# Patient Record
Sex: Female | Born: 1970 | Hispanic: No | Marital: Married | Smoking: Current some day smoker
Health system: Southern US, Community
[De-identification: ages and names within clinical notes are randomized; demographics above are authoritative.]

## PROBLEM LIST (undated history)

## (undated) DIAGNOSIS — N390 Urinary tract infection, site not specified: Secondary | ICD-10-CM

## (undated) DIAGNOSIS — Z789 Other specified health status: Secondary | ICD-10-CM

## (undated) HISTORY — PX: NO PAST SURGERIES: SHX2092

## (undated) HISTORY — DX: Urinary tract infection, site not specified: N39.0

---

## 2020-07-30 ENCOUNTER — Encounter (HOSPITAL_BASED_OUTPATIENT_CLINIC_OR_DEPARTMENT_OTHER): Payer: Self-pay | Admitting: Emergency Medicine

## 2020-07-30 ENCOUNTER — Encounter (HOSPITAL_COMMUNITY): Payer: Self-pay | Admitting: Obstetrics and Gynecology

## 2020-07-30 ENCOUNTER — Inpatient Hospital Stay (HOSPITAL_COMMUNITY)
Admission: AD | Admit: 2020-07-30 | Discharge: 2020-07-31 | Disposition: A | Discharge status: Home / Self Care | Payer: Self-pay | Attending: Obstetrics and Gynecology | Admitting: Obstetrics and Gynecology

## 2020-07-30 ENCOUNTER — Emergency Department (HOSPITAL_BASED_OUTPATIENT_CLINIC_OR_DEPARTMENT_OTHER)
Admission: EM | Admit: 2020-07-30 | Discharge: 2020-07-30 | Disposition: A | Discharge status: Home / Self Care | Payer: Self-pay | Attending: Emergency Medicine | Admitting: Emergency Medicine

## 2020-07-30 ENCOUNTER — Inpatient Hospital Stay (HOSPITAL_COMMUNITY): Payer: Self-pay

## 2020-07-30 ENCOUNTER — Other Ambulatory Visit: Payer: Self-pay

## 2020-07-30 DIAGNOSIS — O3680X0 Pregnancy with inconclusive fetal viability, not applicable or unspecified: Secondary | ICD-10-CM

## 2020-07-30 DIAGNOSIS — O200 Threatened abortion: Secondary | ICD-10-CM | POA: Insufficient documentation

## 2020-07-30 DIAGNOSIS — O209 Hemorrhage in early pregnancy, unspecified: Secondary | ICD-10-CM | POA: Insufficient documentation

## 2020-07-30 DIAGNOSIS — Z3A00 Weeks of gestation of pregnancy not specified: Secondary | ICD-10-CM | POA: Insufficient documentation

## 2020-07-30 DIAGNOSIS — Z3A01 Less than 8 weeks gestation of pregnancy: Secondary | ICD-10-CM | POA: Insufficient documentation

## 2020-07-30 DIAGNOSIS — O3413 Maternal care for benign tumor of corpus uteri, third trimester: Secondary | ICD-10-CM | POA: Insufficient documentation

## 2020-07-30 DIAGNOSIS — D251 Intramural leiomyoma of uterus: Secondary | ICD-10-CM | POA: Insufficient documentation

## 2020-07-30 DIAGNOSIS — O4690 Antepartum hemorrhage, unspecified, unspecified trimester: Secondary | ICD-10-CM

## 2020-07-30 DIAGNOSIS — Z6790 Unspecified blood type, Rh positive: Secondary | ICD-10-CM

## 2020-07-30 HISTORY — DX: Other specified health status: Z78.9

## 2020-07-30 LAB — URINALYSIS, ROUTINE W REFLEX MICROSCOPIC
Bilirubin Urine: NEGATIVE
Glucose, UA: NEGATIVE mg/dL
Ketones, ur: NEGATIVE mg/dL
Nitrite: NEGATIVE
Protein, ur: NEGATIVE mg/dL
Specific Gravity, Urine: 1.01 (ref 1.005–1.030)
pH: 6 (ref 5.0–8.0)

## 2020-07-30 LAB — COMPREHENSIVE METABOLIC PANEL
ALT: 15 U/L (ref 0–44)
AST: 20 U/L (ref 15–41)
Albumin: 4.3 g/dL (ref 3.5–5.0)
Alkaline Phosphatase: 39 U/L (ref 38–126)
Anion gap: 9 (ref 5–15)
BUN: 17 mg/dL (ref 6–20)
CO2: 24 mmol/L (ref 22–32)
Calcium: 9.2 mg/dL (ref 8.9–10.3)
Chloride: 103 mmol/L (ref 98–111)
Creatinine, Ser: 0.73 mg/dL (ref 0.44–1.00)
GFR, Estimated: >60 mL/min (ref >60)
Glucose, Bld: 99 mg/dL (ref 70–99)
Potassium: 3.8 mmol/L (ref 3.5–5.1)
Sodium: 136 mmol/L (ref 135–145)
Total Bilirubin: 0.7 mg/dL (ref 0.3–1.2)
Total Protein: 7.5 g/dL (ref 6.5–8.1)

## 2020-07-30 LAB — CBC WITH DIFFERENTIAL/PLATELET
Abs Immature Granulocytes: 0.04 10*3/uL (ref 0.00–0.07)
Basophils Absolute: 0 10*3/uL (ref 0.0–0.1)
Basophils Relative: 0 %
Eosinophils Absolute: 0.1 10*3/uL (ref 0.0–0.5)
Eosinophils Relative: 1 %
HCT: 42.1 % (ref 36.0–46.0)
Hemoglobin: 13.6 g/dL (ref 12.0–15.0)
Immature Granulocytes: 0 %
Lymphocytes Relative: 16 %
Lymphs Abs: 1.5 10*3/uL (ref 0.7–4.0)
MCH: 29.2 pg (ref 26.0–34.0)
MCHC: 32.3 g/dL (ref 30.0–36.0)
MCV: 90.3 fL (ref 80.0–100.0)
Monocytes Absolute: 0.6 10*3/uL (ref 0.1–1.0)
Monocytes Relative: 6 %
Neutro Abs: 7.1 10*3/uL (ref 1.7–7.7)
Neutrophils Relative %: 77 %
Platelets: 238 10*3/uL (ref 150–400)
RBC: 4.66 MIL/uL (ref 3.87–5.11)
RDW: 12.9 % (ref 11.5–15.5)
WBC: 9.3 10*3/uL (ref 4.0–10.5)
nRBC: 0 % (ref 0.0–0.2)

## 2020-07-30 LAB — PREGNANCY, URINE: Preg Test, Ur: POSITIVE — AB

## 2020-07-30 LAB — ABO/RH: ABO/RH(D): O POS

## 2020-07-30 LAB — URINALYSIS, MICROSCOPIC (REFLEX)

## 2020-07-30 LAB — HCG, QUANTITATIVE, PREGNANCY: hCG, Beta Chain, Quant, S: 448 m[IU]/mL — ABNORMAL HIGH (ref <5)

## 2020-07-30 NOTE — ED Provider Notes (Signed)
[facility] EMERGENCY DEPARTMENT Provider Note   CSN: [id number] Arrival date & time: 07/30/20  1159     History Chief Complaint  Patient presents with  . Vaginal Bleeding    Chloe Tyler is a 49 y.o. female.  HPI   Patient is a 50 year old female G1, P0 whose last menstrual cycle was around November 25.  She states that she had a positive home pregnancy test.  About 5 days ago she began experiencing a small amount of brown vaginal discharge.  Last night she began experiencing lower abdominal cramping as well as vaginal bleeding.  She believes she has soaked about 4 pads since it began last night.  She also believes she has been seeing clots.  Denies any fevers, chills, chest pain, shortness of breath, dysuria.     No past medical history on file.  There are no problems to display for this patient.   OB History    Gravida  1   Para      Term      Preterm      AB      Living        SAB      IAB      Ectopic      Multiple      Live Births              No family history on file.  Social History   Tobacco Use  . Smoking status: Never Smoker  . Smokeless tobacco: Never Used    Home Medications Prior to Admission medications   Not on File    Allergies    Patient has no known allergies.  Review of Systems   Review of Systems  All other systems reviewed and are negative. Ten systems reviewed and are negative for acute change, except as noted in the HPI.   Physical Exam Updated Vital Signs BP 122/85 (BP Location: Right Arm)   Pulse 78   Temp 99.3 F (37.4 C) (Oral)   Resp 17   Ht 5' 4.57" (1.64 m)   Wt 50 kg   LMP 06/21/2020   SpO2 100%   BMI 18.59 kg/m   Physical Exam Vitals and nursing note reviewed.  Constitutional:      General: She is not in acute distress.    Appearance: Normal appearance. She is not ill-appearing, toxic-appearing or diaphoretic.  HENT:     Head: Normocephalic and atraumatic.     Right Ear:  External ear normal.     Left Ear: External ear normal.     Nose: Nose normal.     Mouth/Throat:     Mouth: Mucous membranes are moist.     Pharynx: Oropharynx is clear. No oropharyngeal exudate or posterior oropharyngeal erythema.  Eyes:     Extraocular Movements: Extraocular movements intact.  Cardiovascular:     Rate and Rhythm: Normal rate and regular rhythm.     Pulses: Normal pulses.     Heart sounds: Normal heart sounds. No murmur heard. No friction rub. No gallop.   Pulmonary:     Effort: Pulmonary effort is normal. No respiratory distress.     Breath sounds: Normal breath sounds. No stridor. No wheezing, rhonchi or rales.  Abdominal:     General: Abdomen is flat.     Palpations: Abdomen is soft.     Tenderness: There is abdominal tenderness.     Comments: Mild amount of suprapubic tenderness noted with deep palpation.  Genitourinary:  Comments: Pelvic exam performed by female colleague at request of the patient.  See her note for additional information.  Findings below:  Patient was placed in supine position with stirrups in place. External genitalia appears normal Significant amount of bleeding on the vaginal vault, cervix appears closed at this time.  Bleeding consistent with clots and perhaps tissue present.  CMT was not checked as patient was very uncomfortable with exam.   No samples were collected at this time due to amount of bleeding.  Musculoskeletal:        General: Normal range of motion.     Cervical back: Normal range of motion and neck supple. No tenderness.  Skin:    General: Skin is warm and dry.  Neurological:     General: No focal deficit present.     Mental Status: She is alert and oriented to person, place, and time.  Psychiatric:        Mood and Affect: Mood normal.        Behavior: Behavior normal.    ED Results / Procedures / Treatments   Labs (all labs ordered are listed, but only abnormal results are displayed) Labs Reviewed   URINALYSIS, ROUTINE W REFLEX MICROSCOPIC - Abnormal; Notable for the following components:      Result Value   Hgb urine dipstick LARGE (*)    Leukocytes,Ua MODERATE (*)    All other components within normal limits  PREGNANCY, URINE - Abnormal; Notable for the following components:   Preg Test, Ur POSITIVE (*)    All other components within normal limits  URINALYSIS, MICROSCOPIC (REFLEX) - Abnormal; Notable for the following components:   Bacteria, UA MANY (*)    All other components within normal limits  HCG, QUANTITATIVE, PREGNANCY - Abnormal; Notable for the following components:   hCG, Beta Chain, Quant, S 448 (*)    All other components within normal limits  WET PREP, GENITAL  COMPREHENSIVE METABOLIC PANEL  CBC WITH DIFFERENTIAL/PLATELET  ABO/RH  GC/CHLAMYDIA PROBE AMP ([place]) NOT AT [facility]   EKG None  Radiology No results found.  Procedures Procedures (including critical care time)  Medications Ordered in ED Medications - No data to display  ED Course  I have reviewed the triage vital signs and the nursing notes.  Pertinent labs & imaging results that were available during my care of the patient were reviewed by me and considered in my medical decision making (see chart for details).  Clinical Course as of 07/30/20 2004  Mon Jul 30, 2020  1935 HCG, Beta Chain, Quant, [place](!): 448 [LJ]  1935 Preg Test, Ur(!): POSITIVE [LJ]    Clinical Course User Index [LJ] Placido Sou, PA-C   MDM Rules/Calculators/A&P                          Patient is a 50 year old female that presents the emergency department due to vaginal bleeding.  This started last night.  She took a home pregnancy test which was positive.  Pregnancy test today was also positive.  Quantitative hCG of 448.  Patient and her husband requested a female PA-C perform a pelvic exam.  Findings as noted above.  Patient had a large amount of vaginal bleeding as well as a closed cervical os.  Discussed  transfer to the MAU as we do not currently have ultrasound at our facility and patient and her husband are amenable.  Patient discussed with APP Victorino Dike at [facility] and they will accept  patient.  She will transfer POV.  Her husband is going to transport her.  Recommended that the patient go to the MAU immediately for further evaluation.  Final Clinical Impression(s) / ED Diagnoses Final diagnoses:  Threatened miscarriage in early pregnancy    Rx / DC Orders ED Discharge Orders    None       Rayna Sexton, PA-C 07/30/20 2010    [address] DO 07/30/20 2203

## 2020-07-30 NOTE — MAU Note (Signed)
Pt reports positive preg test and yesterday and today she has had bleeding. Pt reports lower abd and lower back pain. Pt reports she is unable to stand up

## 2020-07-30 NOTE — Discharge Instructions (Addendum)
Like we discussed, please try directly to the maternity assessment unit.  This is located at the [facility] in [place].  The address is below.

## 2020-07-30 NOTE — Discharge Instructions (Signed)
Ectopic Pregnancy ° °An ectopic pregnancy is when the fertilized egg attaches (implants) outside the uterus. Most ectopic pregnancies occur in one of the tubes where eggs travel from the ovary to the uterus (fallopian tubes), but the implanting can occur in other locations. In rare cases, ectopic pregnancies occur on the ovary, intestine, pelvis, abdomen, or cervix. In an ectopic pregnancy, the fertilized egg does not have the ability to develop into a normal, healthy baby. °A ruptured ectopic pregnancy is one in which tearing or bursting of a fallopian tube causes internal bleeding. Often, there is intense lower abdominal pain, and vaginal bleeding sometimes occurs. Having an ectopic pregnancy can be life-threatening. If this dangerous condition is not treated, it can lead to blood loss, shock, or even death. °What are the causes? °The most common cause of this condition is damage to one of the fallopian tubes. A fallopian tube may be narrowed or blocked, and that keeps the fertilized egg from reaching the uterus. °What increases the risk? °This condition is more likely to develop in women of childbearing age who have different levels of risk. The levels of risk can be divided into three categories. °High risk °· You have gone through infertility treatment. °· You have had an ectopic pregnancy before. °· You have had surgery on the fallopian tubes, or another surgical procedure, such as an abortion. °· You have had surgery to have the fallopian tubes tied (tubal ligation). °· You have problems or diseases of the fallopian tubes. °· You have been exposed to diethylstilbestrol (DES). This medicine was used until 1971, and it had effects on babies whose mothers took the medicine. °· You become pregnant while using an IUD (intrauterine device) for birth control. °Moderate risk °· You have a history of infertility. °· You have had an STI (sexually transmitted infection). °· You have a history of pelvic inflammatory  disease (PID). °· You have scarring from endometriosis. °· You have multiple sexual partners. °· You smoke. °Low risk °· You have had pelvic surgery. °· You use vaginal douches. °· You became sexually active before age 18. °What are the signs or symptoms? °Common symptoms of this condition include normal pregnancy symptoms, such as missing a period, nausea, tiredness, abdominal pain, breast tenderness, and bleeding. However, ectopic pregnancy will have additional symptoms, such as: °· Pain with intercourse. °· Irregular vaginal bleeding or spotting. °· Cramping or pain on one side or in the lower abdomen. °· Fast heartbeat, low blood pressure, and sweating. °· Passing out while having a bowel movement. °Symptoms of a ruptured ectopic pregnancy and internal bleeding may include: °· Sudden, severe pain in the abdomen and pelvis. °· Dizziness, weakness, light-headedness, or fainting. °· Pain in the shoulder or neck area. °How is this diagnosed? °This condition is diagnosed by: °· A pelvic exam to locate pain or a mass in the abdomen. °· A pregnancy test. This blood test checks for the presence as well as the specific level of pregnancy hormone in the bloodstream. °· Ultrasound. This is performed if a pregnancy test is positive. In this test, a probe is inserted into the vagina. The probe will detect a fetus, possibly in a location other than the uterus. °· Taking a sample of uterus tissue (dilation and curettage, or D&C). °· Surgery to perform a visual exam of the inside of the abdomen using a thin, lighted tube that has a tiny camera on the end (laparoscope). °· Culdocentesis. This procedure involves inserting a needle at the top of   the vagina, behind the uterus. If blood is present in this area, it may indicate that a fallopian tube is torn. How is this treated? This condition is treated with medicine or surgery. Medicine  An injection of a medicine (methotrexate) may be given to cause the pregnancy tissue to be  absorbed. This medicine may save your fallopian tube. It may be given if: ? The diagnosis is made early, with no signs of active bleeding. ? The fallopian tube has not ruptured. ? You are considered to be a good candidate for the medicine. Usually, pregnancy hormone blood levels are checked after methotrexate treatment. This is to be sure that the medicine is effective. It may take 4-6 weeks for the pregnancy to be absorbed. Most pregnancies will be absorbed by 3 weeks. Surgery  A laparoscope may be used to remove the pregnancy tissue.  If severe internal bleeding occurs, a larger cut (incision) may be made in the lower abdomen (laparotomy) to remove the fetus and placenta. This is done to stop the bleeding.  Part or all of the fallopian tube may be removed (salpingectomy) along with the fetus and placenta. The fallopian tube may also be repaired during the surgery.  In very rare circumstances, removal of the uterus (hysterectomy) may be required.  After surgery, pregnancy hormone testing may be done to be sure that there is no pregnancy tissue left. Whether your treatment is medicine or surgery, you may receive a Rho (D) immune globulin shot to prevent problems with any future pregnancy. This shot may be given if:  You are Rh-negative and the baby's father is Rh-positive.  You are Rh-negative and you do not know the Rh type of the baby's father. Follow these instructions at home:  Rest and limit your activity after the procedure for as long as told by your health care provider.  Until your health care provider says that it is safe: ? Do not lift anything that is heavier than 10 lb (4.5 kg), or the limit that your health care provider tells you. ? Avoid physical exercise and any movement that requires effort (is strenuous).  To help prevent constipation: ? Eat a healthy diet that includes fruits, vegetables, and whole grains. ? Drink 6-8 glasses of water per day. Get help right away  if:  You develop worsening pain that is not relieved by medicine.  You have: ? A fever or chills. ? Vaginal bleeding. ? Redness and swelling at the incision site. ? Nausea and vomiting.  You feel dizzy or weak.  You feel light-headed or you faint. This information is not intended to replace advice given to you by your health care provider. Make sure you discuss any questions you have with your health care provider. Document Revised: 06/26/2017 Document Reviewed: 02/13/2016 Elsevier Patient Education  [place].        Ruptured Ectopic Pregnancy  An ectopic pregnancy is when a fertilized egg attaches (implants) outside of the uterus, usually in a fallopian tube. A ruptured ectopic pregnancy is when the fallopian tube tears or bursts. This results in internal bleeding, intense abdominal pain, and sometimes, vaginal bleeding. Most ectopic pregnancies occur in the fallopian tube. In rare cases, it may occur on the ovary, intestine, pelvis, or cervix. An ectopic pregnancy does not have the ability to develop into a normal, healthy baby. A ruptured ectopic pregnancy can affect your ability to have children (fertility), depending on damage it causes to your reproductive organs. Ruptured ectopic pregnancy is a medical  emergency. If not treated immediately, it can lead to blood loss, shock, or even death. What are the causes? Most ectopic pregnancies are caused by damage to the fallopian tubes. The damage prevents the fertilized egg from implanting in the uterus. In some cases, the cause may not be known. What increases the risk? You are at increased risk for an ectopic pregnancy if:  You have had a previous ectopic pregnancy.  You have had previous fallopian tube surgery.  You have had previous surgery to have the fallopian tubes tied (tubal ligation).  You have had infertility treatments or have a history of infertility.  You have been exposed to DES. DES is a medicine that  was used until 1971 and had effects on babies whose mothers took the medicine.  You use an IUD (intrauterine device) for birth control.  You use progestin-only oral contraception for birth control.  You have a history of pelvic inflammatory disease (PID).  You have a history of endometriosis.  You smoke.  You became sexually active before 48 years of age.  You have multiple sexual partners. What are the signs or symptoms? Symptoms of a ruptured ectopic pregnancy and internal bleeding may include:  Sudden, severe pain in the abdomen and pelvis.  Dizziness or fainting.  Pain in the shoulder area.  Vaginal bleeding. How is this diagnosed? This condition is diagnosed based on your medical history, symptoms, a physical exam, and tests, which may include:  A pregnancy test.  An ultrasound.  Measuring the levels of the pregnancy hormone in the bloodstream.  Taking a sample of tissue from the uterus (dilation and curettage, D&C).  Surgery to visually examine the inside of the abdomen using a lighted tube (laparoscopy). How is this treated? This condition is treated with IV fluids and emergency surgery to remove the ectopic pregnancy and repair the area where the rupture occured. If you have lost a lot of blood, you may need a blood transfusion. If you are Rh negative and your baby's father is Rh positive, or the Rh type of the father is unknown, you may receive a Rho (D) immune globulin shot. This is to prevent Rh problems in future pregnancies. Additional medicines may be given. Get help right away if:  You are taking medicines to treat an ectopic pregnancy and you develop symptoms of a rupture. These include: ? Fever or chills. ? Shoulder pain. ? Vaginal bleeding. ? Nausea and vomiting. ? Severe abdominal pain or cramping. ? Feeling light-headed or fainting. Summary  An ectopic pregnancy is when a fertilized egg attaches (implants) outside of the uterus, usually in a  fallopian tube. A ruptured ectopic pregnancy is when the fallopian tube tears or bursts.  Ruptured ectopic pregnancy is a medical emergency. If not treated immediately, it can lead to blood loss, shock, or even death.  This condition is treated with IV fluids and emergency surgery to remove the ectopic pregnancy and repair the area where the rupture occured. If you have lost a lot of blood, you may need a blood transfusion. This information is not intended to replace advice given to you by your health care provider. Make sure you discuss any questions you have with your health care provider. Document Revised: 06/26/2017 Document Reviewed: 10/01/2016 Elsevier Patient Education  [place].         Threatened Miscarriage  A threatened miscarriage occurs when a woman has vaginal bleeding during the first 20 weeks of pregnancy but the pregnancy has not ended. If  you have vaginal bleeding during this time, your health care provider will do tests to make sure you are still pregnant. If the tests show that you are still pregnant and that the developing baby (fetus) inside your uterus is still growing, your condition is considered a threatened miscarriage. A threatened miscarriage does not mean your pregnancy will end, but it does increase the risk of losing your pregnancy (complete miscarriage). What are the causes? The cause of this condition is usually not known. For women who go on to have a complete miscarriage, the most common cause is an abnormal number of chromosomes in the developing baby. Chromosomes are the structures inside cells that hold all of a person's genetic material. What increases the risk? The following lifestyle factors may increase your risk of a miscarriage in early pregnancy:  Smoking.  Drinking excessive amounts of alcohol or caffeine.  Recreational drug use. The following preexisting health conditions may increase your risk of a miscarriage in early  pregnancy:  Polycystic ovary syndrome.  Uterine fibroids.  Infections.  Diabetes mellitus. What are the signs or symptoms? Symptoms of this condition include:  Vaginal bleeding.  Mild abdominal pain or cramps. How is this diagnosed? If you have bleeding with or without abdominal pain before 20 weeks of pregnancy, your health care provider will do tests to check whether you are still pregnant. These will include:  Ultrasound. This test uses sound waves to create images of the inside of your uterus. This allows your health care provider to look at your developing baby and other structures, such as your placenta.  Pelvic exam. This is an internal exam of your vagina and cervix.  Measurement of your baby's heart rate.  Laboratory tests such as blood tests, urine tests, or swabs for infection You may be diagnosed with a threatened miscarriage if:  Ultrasound testing shows that you are still pregnant.  Your babys heart rate is strong.  A pelvic exam shows that the opening between your uterus and your vagina (cervix) is closed.  Blood tests confirm that you are still pregnant. How is this treated? No treatments have been shown to prevent a threatened miscarriage from going on to a complete miscarriage. However, the right home care is important. Follow these instructions at home:  Get plenty of rest.  Do not have sex or use tampons if you have vaginal bleeding.  Do not douche.  Do not smoke or use recreational drugs.  Do not drink alcohol.  Avoid caffeine.  Keep all follow-up prenatal visits as told by your health care provider. This is important. Contact a health care provider if:  You have light vaginal bleeding or spotting while pregnant.  You have abdominal pain or cramping.  You have a fever. Get help right away if:  You have heavy vaginal bleeding.  You have blood clots coming from your vagina.  You pass tissue from your vagina.  You leak fluid, or you  have a gush of fluid from your vagina.  You have severe low back pain or abdominal cramps.  You have fever, chills, and severe abdominal pain. Summary  A threatened miscarriage occurs when a woman has vaginal bleeding during the first 20 weeks of pregnancy but the pregnancy has not ended.  The cause of a threatened miscarriage is usually not known.  Symptoms of this condition may include vaginal bleeding and mild abdominal pain or cramps.  No treatments have been shown to prevent a threatened miscarriage from going on to a complete  miscarriage.  Keep all follow-up prenatal visits as told by your health care provider. This is important. This information is not intended to replace advice given to you by your health care provider. Make sure you discuss any questions you have with your health care provider. Document Revised: 08/20/2017 Document Reviewed: 10/10/2016 Elsevier Patient Education  [place].         Vaginal Bleeding During Pregnancy, First Trimester  A small amount of bleeding from the vagina (spotting) is relatively common during early pregnancy. It usually stops on its own. Various things may cause bleeding or spotting during early pregnancy. Some bleeding may be related to the pregnancy, and some may not. In many cases, the bleeding is normal and is not a problem. However, bleeding can also be a sign of something serious. Be sure to tell your health care provider about any vaginal bleeding right away. Some possible causes of vaginal bleeding during the first trimester include:  Infection or inflammation of the cervix.  Growths (polyps) on the cervix.  Miscarriage or threatened miscarriage.  Pregnancy tissue developing outside of the uterus (ectopic pregnancy).  A mass of tissue developing in the uterus due to an egg being fertilized incorrectly (molar pregnancy). Follow these instructions at home: Activity  Follow instructions from your health care  provider about limiting your activity. Ask what activities are safe for you.  If needed, make plans for someone to help with your regular activities.  Do not have sex or orgasms until your health care provider says that this is safe. General instructions  Take over-the-counter and prescription medicines only as told by your health care provider.  Pay attention to any changes in your symptoms.  Do not use tampons or douche.  Write down how many pads you use each day, how often you change pads, and how soaked (saturated) they are.  If you pass any tissue from your vagina, save the tissue so you can show it to your health care provider.  Keep all follow-up visits as told by your health care provider. This is important. Contact a health care provider if:  You have vaginal bleeding during any part of your pregnancy.  You have cramps or labor pains.  You have a fever. Get help right away if:  You have severe cramps in your back or abdomen.  You pass large clots or a large amount of tissue from your vagina.  Your bleeding increases.  You feel light-headed or weak, or you faint.  You have chills.  You are leaking fluid or have a gush of fluid from your vagina. Summary  A small amount of bleeding (spotting) from the vagina is relatively common during early pregnancy.  Various things may cause bleeding or spotting in early pregnancy.  Be sure to tell your health care provider about any vaginal bleeding right away. This information is not intended to replace advice given to you by your health care provider. Make sure you discuss any questions you have with your health care provider. Document Revised: 11/02/2018 Document Reviewed: 10/16/2016 Elsevier Patient Education  2020 [place] A miscarriage is the loss of an unborn baby (fetus) before the 20th week of pregnancy. Most miscarriages happen during the first 3 months of pregnancy. Sometimes, a  miscarriage can happen before a woman knows that she is pregnant. Having a miscarriage can be an emotional experience. If you have had a miscarriage, talk with your health care provider about any  questions you may have about miscarrying, the grieving process, and your plans for future pregnancy. What are the causes? A miscarriage may be caused by:  Problems with the genes or chromosomes of the fetus. These problems make it impossible for the baby to develop normally. They are often the result of random errors that occur early in the development of the baby, and are not passed from parent to child (not inherited).  Infection of the cervix or uterus.  Conditions that affect hormone balance in the body.  Problems with the cervix, such as the cervix opening and thinning before pregnancy is at term (cervical insufficiency).  Problems with the uterus. These may include: ? A uterus with an abnormal shape. ? Fibroids in the uterus. ? Congenital abnormalities. These are problems that were present at birth.  Certain medical conditions.  Smoking, drinking alcohol, or using drugs.  Injury (trauma). In many cases, the cause of a miscarriage is not known. What are the signs or symptoms? Symptoms of this condition include:  Vaginal bleeding or spotting, with or without cramps or pain.  Pain or cramping in the abdomen or lower back.  Passing fluid, tissue, or blood clots from the vagina. How is this diagnosed? This condition may be diagnosed based on:  A physical exam.  Ultrasound.  Blood tests.  Urine tests. How is this treated? Treatment for a miscarriage is sometimes not necessary if you naturally pass all the tissue that was in your uterus. If necessary, this condition may be treated with:  Dilation and curettage (D&C). This is a procedure in which the cervix is stretched open and the lining of the uterus (endometrium) is scraped. This is done only if tissue from the fetus or  placenta remains in the body (incomplete miscarriage).  Medicines, such as: ? Antibiotic medicine, to treat infection. ? Medicine to help the body pass any remaining tissue. ? Medicine to reduce (contract) the size of the uterus. These medicines may be given if you have a lot of bleeding. If you have Rh negative blood and your baby was Rh positive, you will need a shot of a medicine called Rh immunoglobulinto protect your future babies from Rh blood problems. "Rh-negative" and "Rh-positive" refer to whether or not the blood has a specific protein found on the surface of red blood cells (Rh factor). Follow these instructions at home: Medicines   Take over-the-counter and prescription medicines only as told by your health care provider.  If you were prescribed antibiotic medicine, take it as told by your health care provider. Do not stop taking the antibiotic even if you start to feel better.  Do not take NSAIDs, such as aspirin and ibuprofen, unless they are approved by your health care provider. These medicines can cause bleeding. Activity  Rest as directed. Ask your health care provider what activities are safe for you.  Have someone help with home and family responsibilities during this time. General instructions  Keep track of the number of sanitary pads you use each day and how soaked (saturated) they are. Write down this information.  Monitor the amount of tissue or blood clots that you pass from your vagina. Save any large amounts of tissue for your health care provider to examine.  Do not use tampons, douche, or have sex until your health care provider approves.  To help you and your partner with the process of grieving, talk with your health care provider or seek counseling.  When you are ready, meet with your  health care provider to discuss any important steps you should take for your health. Also, discuss steps you should take to have a healthy pregnancy in the  future.  Keep all follow-up visits as told by your health care provider. This is important. Where to find more information  The American Congress of Obstetricians and Gynecologists: www.acog.org  [place] Department of Health and [employer], systems of Womens Health: [place] Contact a health care provider if:  You have a fever or chills.  You have a foul smelling vaginal discharge.  You have more bleeding instead of less. Get help right away if:  You have severe cramps or pain in your back or abdomen.  You pass blood clots or tissue from your vagina that is walnut-sized or larger.  You soak more than 1 regular sanitary pad in an hour.  You become light-headed or weak.  You pass out.  You have feelings of sadness that take over your thoughts, or you have thoughts of hurting yourself. Summary  Most miscarriages happen in the first 3 months of pregnancy. Sometimes miscarriage happens before a woman even knows that she is pregnant.  Follow your health care provider's instruction for home care. Keep all follow-up appointments.  To help you and your partner with the process of grieving, talk with your health care provider or seek counseling. This information is not intended to replace advice given to you by your health care provider. Make sure you discuss any questions you have with your health care provider. Document Revised: 11/05/2018 Document Reviewed: 08/19/2016 Elsevier Patient Education  [place] of Pregnancy The first trimester of pregnancy is from week 1 until the end of week 13 (months 1 through 3). A week after a sperm fertilizes an egg, the egg will implant on the wall of the uterus. This embryo will begin to develop into a baby. Genes from you and your partner will form the baby. The female genes will determine whether the baby will be a boy or a girl. At 6-8 weeks, the eyes and face will be formed, and the heartbeat can  be seen on ultrasound. At the end of 12 weeks, all the baby's organs will be formed. Now that you are pregnant, you will want to do everything you can to have a healthy baby. Two of the most important things are to get good prenatal care and to follow your health care provider's instructions. Prenatal care is all the medical care you receive before the baby's birth. This care will help prevent, find, and treat any problems during the pregnancy and childbirth. Body changes during your first trimester Your body goes through many changes during pregnancy. The changes vary from woman to woman.  You may gain or lose a couple of pounds at first.  You may feel sick to your stomach (nauseous) and you may throw up (vomit). If the vomiting is uncontrollable, call your health care provider.  You may tire easily.  You may develop headaches that can be relieved by medicines. All medicines should be approved by your health care provider.  You may urinate more often. Painful urination may mean you have a bladder infection.  You may develop heartburn as a result of your pregnancy.  You may develop constipation because certain hormones are causing the muscles that push stool through your intestines to slow down.  You may develop hemorrhoids or swollen veins (varicose veins).  Your breasts may begin to  grow larger and become tender. Your nipples may stick out more, and the tissue that surrounds them (areola) may become darker.  Your gums may bleed and may be sensitive to brushing and flossing.  Dark spots or blotches (chloasma, mask of pregnancy) may develop on your face. This will likely fade after the baby is born.  Your menstrual periods will stop.  You may have a loss of appetite.  You may develop cravings for certain kinds of food.  You may have changes in your emotions from day to day, such as being excited to be pregnant or being concerned that something may go wrong with the pregnancy and  baby.  You may have more vivid and strange dreams.  You may have changes in your hair. These can include thickening of your hair, rapid growth, and changes in texture. Some women also have hair loss during or after pregnancy, or hair that feels dry or thin. Your hair will most likely return to normal after your baby is born. What to expect at prenatal visits During a routine prenatal visit:  You will be weighed to make sure you and the baby are growing normally.  Your blood pressure will be taken.  Your abdomen will be measured to track your baby's growth.  The fetal heartbeat will be listened to between weeks 10 and 14 of your pregnancy.  Test results from any previous visits will be discussed. Your health care provider may ask you:  How you are feeling.  If you are feeling the baby move.  If you have had any abnormal symptoms, such as leaking fluid, bleeding, severe headaches, or abdominal cramping.  If you are using any tobacco products, including cigarettes, chewing tobacco, and electronic cigarettes.  If you have any questions. Other tests that may be performed during your first trimester include:  Blood tests to find your blood type and to check for the presence of any previous infections. The tests will also be used to check for low iron levels (anemia) and protein on red blood cells (Rh antibodies). Depending on your risk factors, or if you previously had diabetes during pregnancy, you may have tests to check for high blood sugar that affects pregnant women (gestational diabetes).  Urine tests to check for infections, diabetes, or protein in the urine.  An ultrasound to confirm the proper growth and development of the baby.  Fetal screens for spinal cord problems (spina bifida) and Down syndrome.  HIV (human immunodeficiency virus) testing. Routine prenatal testing includes screening for HIV, unless you choose not to have this test.  You may need other tests to make  sure you and the baby are doing well. Follow these instructions at home: Medicines  Follow your health care provider's instructions regarding medicine use. Specific medicines may be either safe or unsafe to take during pregnancy.  Take a prenatal vitamin that contains at least 600 micrograms (mcg) of folic acid.  If you develop constipation, try taking a stool softener if your health care provider approves. Eating and drinking   Eat a balanced diet that includes fresh fruits and vegetables, whole grains, good sources of protein such as meat, eggs, or tofu, and low-fat dairy. Your health care provider will help you determine the amount of weight gain that is right for you.  Avoid raw meat and uncooked cheese. These carry germs that can cause birth defects in the baby.  Eating four or five small meals rather than three large meals a day may help relieve  nausea and vomiting. If you start to feel nauseous, eating a few soda crackers can be helpful. Drinking liquids between meals, instead of during meals, also seems to help ease nausea and vomiting.  Limit foods that are high in fat and processed sugars, such as fried and sweet foods.  To prevent constipation: ? Eat foods that are high in fiber, such as fresh fruits and vegetables, whole grains, and beans. ? Drink enough fluid to keep your urine clear or pale yellow. Activity  Exercise only as directed by your health care provider. Most women can continue their usual exercise routine during pregnancy. Try to exercise for 30 minutes at least 5 days a week. Exercising will help you: ? Control your weight. ? Stay in shape. ? Be prepared for labor and delivery.  Experiencing pain or cramping in the lower abdomen or lower back is a good sign that you should stop exercising. Check with your health care provider before continuing with normal exercises.  Try to avoid standing for long periods of time. Move your legs often if you must stand in one  place for a long time.  Avoid heavy lifting.  Wear low-heeled shoes and practice good posture.  You may continue to have sex unless your health care provider tells you not to. Relieving pain and discomfort  Wear a good support bra to relieve breast tenderness.  Take warm sitz baths to soothe any pain or discomfort caused by hemorrhoids. Use hemorrhoid cream if your health care provider approves.  Rest with your legs elevated if you have leg cramps or low back pain.  If you develop varicose veins in your legs, wear support hose. Elevate your feet for 15 minutes, 3-4 times a day. Limit salt in your diet. Prenatal care  Schedule your prenatal visits by the twelfth week of pregnancy. They are usually scheduled monthly at first, then more often in the last 2 months before delivery.  Write down your questions. Take them to your prenatal visits.  Keep all your prenatal visits as told by your health care provider. This is important. Safety  Wear your seat belt at all times when driving.  Make a list of emergency phone numbers, including numbers for family, friends, the hospital, and police and fire departments. General instructions  Ask your health care provider for a referral to a local prenatal education class. Begin classes no later than the beginning of month 6 of your pregnancy.  Ask for help if you have counseling or nutritional needs during pregnancy. Your health care provider can offer advice or refer you to specialists for help with various needs.  Do not use hot tubs, steam rooms, or saunas.  Do not douche or use tampons or scented sanitary pads.  Do not cross your legs for long periods of time.  Avoid cat litter boxes and soil used by cats. These carry germs that can cause birth defects in the baby and possibly loss of the fetus by miscarriage or stillbirth.  Avoid all smoking, herbs, alcohol, and medicines not prescribed by your health care provider. Chemicals in these  products affect the formation and growth of the baby.  Do not use any products that contain nicotine or tobacco, such as cigarettes and e-cigarettes. If you need help quitting, ask your health care provider. You may receive counseling support and other resources to help you quit.  Schedule a dentist appointment. At home, brush your teeth with a soft toothbrush and be gentle when you floss. Contact a  health care provider if:  You have dizziness.  You have mild pelvic cramps, pelvic pressure, or nagging pain in the abdominal area.  You have persistent nausea, vomiting, or diarrhea.  You have a bad smelling vaginal discharge.  You have pain when you urinate.  You notice increased swelling in your face, hands, legs, or ankles.  You are exposed to fifth disease or chickenpox.  You are exposed to [place] measles (rubella) and have never had it. Get help right away if:  You have a fever.  You are leaking fluid from your vagina.  You have spotting or bleeding from your vagina.  You have severe abdominal cramping or pain.  You have rapid weight gain or loss.  You vomit blood or material that looks like coffee grounds.  You develop a severe headache.  You have shortness of breath.  You have any kind of trauma, such as from a fall or a car accident. Summary  The first trimester of pregnancy is from week 1 until the end of week 13 (months 1 through 3).  Your body goes through many changes during pregnancy. The changes vary from woman to woman.  You will have routine prenatal visits. During those visits, your health care provider will examine you, discuss any test results you may have, and talk with you about how you are feeling. This information is not intended to replace advice given to you by your health care provider. Make sure you discuss any questions you have with your health care provider. Document Revised: 06/26/2017 Document Reviewed: 06/25/2016 Elsevier Patient  Education  [place].                        Safe Medications in Pregnancy    Acne: Benzoyl Peroxide Salicylic Acid  Backache/Headache: Tylenol: 2 regular strength every 4 hours OR              2 Extra strength every 6 hours  Colds/Coughs/Allergies: Benadryl (alcohol free) 25 mg every 6 hours as needed Breath right strips Claritin Cepacol throat lozenges Chloraseptic throat spray Cold-Eeze- up to three times per day Cough drops, alcohol free Flonase (by prescription only) Guaifenesin Mucinex Robitussin DM (plain only, alcohol free) Saline nasal spray/drops Sudafed (pseudoephedrine) & Actifed ** use only after [redacted] weeks gestation and if you do not have high blood pressure Tylenol Vicks Vaporub Zinc lozenges Zyrtec   Constipation: Colace Ducolax suppositories Fleet enema Glycerin suppositories Metamucil Milk of magnesia Miralax Senokot Smooth move tea  Diarrhea: Kaopectate Imodium A-D  *NO pepto Bismol  Hemorrhoids: Anusol Anusol HC Preparation H Tucks  Indigestion: Tums Maalox Mylanta Zantac  Pepcid  Insomnia: Benadryl (alcohol free) 25mg  every 6 hours as needed Tylenol PM Unisom, no Gelcaps  Leg Cramps: Tums MagGel  Nausea/Vomiting:  Bonine Dramamine Emetrol Ginger extract Sea bands Meclizine  Nausea medication to take during pregnancy:  Unisom (doxylamine succinate 25 mg tablets) Take one tablet daily at bedtime. If symptoms are not adequately controlled, the dose can be increased to a maximum recommended dose of two tablets daily (1/2 tablet in the morning, 1/2 tablet mid-afternoon and one at bedtime). Vitamin B6 100mg  tablets. Take one tablet twice a day (up to 200 mg per day).  Skin Rashes: Aveeno products Benadryl cream or 25mg  every 6 hours as needed Calamine Lotion 1% cortisone cream  Yeast infection: Gyne-lotrimin 7 Monistat 7   **If taking multiple medications, please check labels to avoid duplicating  the same active ingredients **  take medication as directed on the label ** Do not exceed 4000 mg of tylenol in 24 hours **Do not take medications that contain aspirin or ibuprofen

## 2020-07-30 NOTE — MAU Provider Note (Signed)
History     CSN: [id number]  Arrival date and time: 07/30/20 2045   Event Date/Time   First Provider Initiated Contact with Patient 07/30/20 2233      Chief Complaint  Patient presents with  . Vaginal Bleeding   [name] is a 50 y.o. G1P0 at Unknown who presents to MAU for vaginal bleeding which began last night. Patient reports the bleeding was less than a period. Patient reports she initially presented to [facility] but they did not have [place] available and sent her to MAU for evaluation. Patient showed provider a panty liner she had been wearing for 4 hours with minimal blood. Patient also showed provider a small clot in the toilet. Patient denies abdominal or pelvic pain at this time. Patient reports she called her doctor in [place] and was told to get a prescription medication to stop the bleeding. Patient reports they went to [facility] and were told they have the medication, but she would need to get it prescribed by a provider, which is why she came to MAU.  Passing blood clots? Per above Blood soaking clothes? no Lightheaded/dizzy? no Significant pelvic pain or cramping? no  Current pregnancy problems? Pt has not yet been seen Blood Type? O positive  Pt denies vaginal discharge/odor/itching. Pt denies N/V, abdominal pain, constipation, diarrhea, or urinary problems. Pt denies fever, chills, fatigue, sweating or changes in appetite. Pt denies SOB or chest pain. Pt denies dizziness, HA, light-headedness, weakness.  Patient's husband present for entire visit. Patient and husband offered translator, but decline.   OB History    Gravida  1   Para      Term      Preterm      AB      Living        SAB      IAB      Ectopic      Multiple      Live Births              Past Medical History:  Diagnosis Date  . Medical history non-contributory     Past Surgical History:  Procedure Laterality Date  . NO PAST SURGERIES      No family history on  file.  Social History   Tobacco Use  . Smoking status: Never Smoker  . Smokeless tobacco: Never Used  Substance Use Topics  . Alcohol use: Never  . Drug use: Never    Allergies: No Known Allergies  No medications prior to admission.    Review of Systems  Constitutional: Negative for chills, diaphoresis, fatigue and fever.  Eyes: Negative for visual disturbance.  Respiratory: Negative for shortness of breath.   Cardiovascular: Negative for chest pain.  Gastrointestinal: Negative for abdominal pain, constipation, diarrhea, nausea and vomiting.  Genitourinary: Positive for vaginal bleeding. Negative for dysuria, flank pain, frequency, pelvic pain, urgency and vaginal discharge.  Neurological: Negative for dizziness, weakness, light-headedness and headaches.   Physical Exam   Blood pressure 104/74, pulse 88, temperature 98.5 F (36.9 C), temperature source Oral, resp. rate 18, last menstrual period 06/21/2020, SpO2 98 %.  Patient Vitals for the past 24 hrs:  BP Temp Temp src Pulse Resp SpO2  07/31/20 0007 - - - - 18 -  07/30/20 2105 104/74 98.5 F (36.9 C) Oral 88 16 98 %   Physical Exam Constitutional:      General: She is not in acute distress.    Appearance: She is well-developed and well-nourished. She is  not diaphoretic.  HENT:     Head: Normocephalic and atraumatic.  Pulmonary:     Effort: Pulmonary effort is normal.  Skin:    General: Skin is warm and dry.  Neurological:     Mental Status: She is alert and oriented to person, place, and time.  Psychiatric:        Mood and Affect: Mood and affect normal.        Behavior: Behavior normal.        Thought Content: Thought content normal.        Judgment: Judgment normal.    Results for orders placed or performed during the hospital encounter of 07/30/20 (from the past 24 hour(s))  Urinalysis, Routine w reflex microscopic Urine, Clean Catch     Status: Abnormal   Collection Time: 07/30/20 12:25 PM  Result  Value Ref Range   Color, Urine YELLOW YELLOW   APPearance CLEAR CLEAR   Specific Gravity, Urine 1.010 1.005 - 1.030   pH 6.0 5.0 - 8.0   Glucose, UA NEGATIVE NEGATIVE mg/dL   Hgb urine dipstick LARGE (A) NEGATIVE   Bilirubin Urine NEGATIVE NEGATIVE   Ketones, ur NEGATIVE NEGATIVE mg/dL   Protein, ur NEGATIVE NEGATIVE mg/dL   Nitrite NEGATIVE NEGATIVE   Leukocytes,Ua MODERATE (A) NEGATIVE  Pregnancy, urine     Status: Abnormal   Collection Time: 07/30/20 12:25 PM  Result Value Ref Range   Preg Test, Ur POSITIVE (A) NEGATIVE  Urinalysis, Microscopic (reflex)     Status: Abnormal   Collection Time: 07/30/20 12:25 PM  Result Value Ref Range   RBC / HPF 11-20 0 - 5 RBC/hpf   WBC, UA 11-20 0 - 5 WBC/hpf   Bacteria, UA MANY (A) NONE SEEN   Squamous Epithelial / LPF 0-5 0 - 5  Comprehensive metabolic panel     Status: None   Collection Time: 07/30/20  2:20 PM  Result Value Ref Range   Sodium 136 135 - 145 mmol/L   Potassium 3.8 3.5 - 5.1 mmol/L   Chloride 103 98 - 111 mmol/L   CO2 24 22 - 32 mmol/L   Glucose, Bld 99 70 - 99 mg/dL   BUN 17 6 - 20 mg/dL   Creatinine, Ser 5.99 0.44 - 1.00 mg/dL   Calcium 9.2 8.9 - 35.7 mg/dL   Total Protein 7.5 6.5 - 8.1 g/dL   Albumin 4.3 3.5 - 5.0 g/dL   AST 20 15 - 41 U/L   ALT 15 0 - 44 U/L   Alkaline Phosphatase 39 38 - 126 U/L   Total Bilirubin 0.7 0.3 - 1.2 mg/dL   GFR, Estimated >01 >77 mL/min   Anion gap 9 5 - 15  CBC with Differential     Status: None   Collection Time: 07/30/20  2:20 PM  Result Value Ref Range   WBC 9.3 4.0 - 10.5 K/uL   RBC 4.66 3.87 - 5.11 MIL/uL   Hemoglobin 13.6 12.0 - 15.0 g/dL   HCT 93.9 03.0 - 09.2 %   MCV 90.3 80.0 - 100.0 fL   MCH 29.2 26.0 - 34.0 pg   MCHC 32.3 30.0 - 36.0 g/dL   RDW 33.0 07.6 - 22.6 %   Platelets 238 150 - 400 K/uL   nRBC 0.0 0.0 - 0.2 %   Neutrophils Relative % 77 %   Neutro Abs 7.1 1.7 - 7.7 K/uL   Lymphocytes Relative 16 %   Lymphs Abs 1.5 0.7 - 4.0 K/uL  Monocytes Relative 6  %   Monocytes Absolute 0.6 0.1 - 1.0 K/uL   Eosinophils Relative 1 %   Eosinophils Absolute 0.1 0.0 - 0.5 K/uL   Basophils Relative 0 %   Basophils Absolute 0.0 0.0 - 0.1 K/uL   Immature Granulocytes 0 %   Abs Immature Granulocytes 0.04 0.00 - 0.07 K/uL  hCG, quantitative, pregnancy     Status: Abnormal   Collection Time: 07/30/20  2:20 PM  Result Value Ref Range   hCG, Beta Chain, Quant, S 448 (H) <5 mIU/mL  ABO/Rh     Status: None   Collection Time: 07/30/20  2:20 PM  Result Value Ref Range   ABO/RH(D) O POS    No rh immune globuloin      NOT A RH IMMUNE GLOBULIN CANDIDATE, PT RH POSITIVE Performed at [facility], [place] [address], [place], [place] [address]    US OB LESS THAN 14 WEEKS WITH OB TRANSVAGINAL  Result Date: 07/30/2020 CLINICAL DATA:  Initial evaluation for acute vaginal bleeding, early pregnancy. EXAM: OBSTETRIC <14 WK [place] AND TRANSVAGINAL OB US TECHNIQUE: Both transabdominal and transvaginal ultrasound examinations were performed for complete evaluation of the gestation as well as the maternal uterus, adnexal regions, and pelvic cul-de-sac. Transvaginal technique was performed to assess early pregnancy. COMPARISON:  None available. FINDINGS: Intrauterine gestational sac: None definite. Subcentimeter cystic lesion within the endometrial complex noted on sin a clip, indeterminate, and could reflect a small subendometrial cyst. Yolk sac:  Negative. Embryo:  Negative. Cardiac Activity: Negative. Heart Rate: N/A Subchorionic hemorrhage:  None visualized. Maternal uterus/adnexae: Ovaries are within normal limits bilaterally, although the left ovary is somewhat difficult to visualize. No adnexal mass or free fluid. Probable 3.5 x 2.3 x 2.7 cm intramural fibroid present at the mid posterior uterine body. Underlying uterus is anteverted. IMPRESSION: 1. Early pregnancy with no discrete IUP or adnexal mass identified. Finding is consistent with a pregnancy of unknown anatomic location.  Differential considerations include IUP to early to visualize, recent SAB, or possibly occult ectopic pregnancy. Close clinical monitoring with serial beta HCGs and close interval follow-up ultrasound recommended as clinically warranted. 2. 3.5 cm intramural fibroid at the posterior uterine body. 3. No other acute maternal uterine or adnexal abnormality identified. Electronically Signed   By: Jeannine Boga M.D.   On: 07/30/2020 22:10    MAU Course  Procedures  MDM -r/o ectopic -UA: lg hgb/mod leuks/many bacteria, sending urine for culture -CBC: WNL -CMP: WNL -[place]: FINDINGS: PUL, Intrauterine gestational sac: None definite. Subcentimeter cystic lesion within the endometrial complex noted on sin a clip, indeterminate, and could reflect a small subendometrial cyst. Probable 3.5 x 2.3 x 2.7 cm intramural fibroid present at the mid posterior uterine body. Underlying uterus is anteverted. -hCG: 448 -ABO: O Positive -WetPrep/GC/CT declined -Discussed with client the diagnosis of pregnancy of unknown anatomic location.  Three possibilities of outcome are: a healthy pregnancy that is too early to see a yolk sac to confirm the pregnancy is in the uterus, a pregnancy that is not healthy and has not developed and will not develop, and an ectopic pregnancy that is in the abdomen that cannot be identified at this time.  And ectopic pregnancy can be a life threatening situation as a pregnancy needs to be in the uterus which is a muscle and can stretch to accommodate the growth of a pregnancy.  Other structures in the pelvis and abdomen as not muscular and do not stretch with the growth  of a pregnancy.  Worst case scenario is that a structure ruptures with a growing pregnancy not in the uterus and and internal hemorrhage can be a life threatening situation.  We need to follow the progression of this pregnancy carefully.  We need to check another serum pregnancy hormone level to determine if the levels are  rising appropriately  and to determine the next steps that are needed for you. Patient's questions were answered. -pt discharged to home in stable condition  Orders Placed This Encounter  Procedures  . Culture, OB Urine    Standing Status:   Standing    Number of Occurrences:   1  . US OB LESS THAN 14 WEEKS WITH OB TRANSVAGINAL    Standing Status:   Standing    Number of Occurrences:   1    Order Specific Question:   Symptom/Reason for Exam    Answer:   Vaginal bleeding affecting early pregnancy [[id number]]  . Discharge patient    Order Specific Question:   Discharge disposition    Answer:   01-Home or Self Care [1]    Order Specific Question:   Discharge patient date    Answer:   07/30/2020   No orders of the defined types were placed in this encounter.   Assessment and Plan   1. Pregnancy of unknown anatomic location   2. Vaginal bleeding affecting early pregnancy   3. Blood type, Rh positive     Allergies as of 07/31/2020   No Known Allergies     Medication List    You have not been prescribed any medications.     -will call with culture results, if positive -safe meds in pregnancy list given -discussed ectopic vs. SAB vs. miscarriage -strict ectopic precautions given -return MAU precautions -f/u on 08/01/2020 at [facility] for repeat hCG -pt discharged to home in stable condition  Joni Reining E Kaegan Hettich 07/31/2020, 12:42 AM

## 2020-07-30 NOTE — ED Triage Notes (Signed)
Pt tested positive at home for pregnancy.  Now is having vaginal bleeding since last night.  Last period 06/21/20

## 2020-07-30 NOTE — ED Provider Notes (Signed)
7:48 PM Pelvic exam performed by me with Gordy Councilman RN as chaperone.  Patient was placed in supine position with stirrups in place. External genitalia appears normal Significant amount of bleeding on the vaginal vault, cervix appears closed at this time.  Bleeding consistent with clots and perhaps tissue present.  CMT was not checked as patient was very uncomfortable with exam.   No samples were collected at this time due to amount of bleeding.    Portions of this note were generated with Scientist, clinical (histocompatibility and immunogenetics). Dictation errors may occur despite best attempts at proofreading.    Claude Manges, PA-C 07/30/20 1951    Melene Plan, DO 07/30/20 2203

## 2020-07-30 NOTE — MAU Provider Note (Incomplete)
History     CSN: [id number]  Arrival date and time: 07/30/20 2045   Event Date/Time   First Provider Initiated Contact with Patient 07/30/20 2233      Chief Complaint  Patient presents with  . Vaginal Bleeding   [name] is a 50 y.o. G1P0 at Unknown who presents to MAU for vaginal bleeding which began last night. Patient reports the bleeding was less than a period. Patient reports she initially presented to [facility] but they did not have [place] available and sent her to MAU for evaluation. Patient showed provider a panty liner she had been wearing for 4 hours with minimal blood. Patient also showed provider a small clot in the toilet. Patient denies abdominal or pelvic pain at this time. Patient reports she called her doctor in [place] and was told to get a prescription medication to stop the bleeding. Patient reports they went to [facility] and were told they have the medication, but she would need to get it prescribed by a provider, which is why she came to MAU.  Passing blood clots? Per above Blood soaking clothes? no Lightheaded/dizzy? no Significant pelvic pain or cramping? no  Current pregnancy problems? Pt has not yet been seen Blood Type? O positive  Pt denies vaginal discharge/odor/itching. Pt denies N/V, abdominal pain, constipation, diarrhea, or urinary problems. Pt denies fever, chills, fatigue, sweating or changes in appetite. Pt denies SOB or chest pain. Pt denies dizziness, HA, light-headedness, weakness.  Patient's husband present for entire visit. Patient and husband offered translator, but decline.   OB History    Gravida  1   Para      Term      Preterm      AB      Living        SAB      IAB      Ectopic      Multiple      Live Births              Past Medical History:  Diagnosis Date  . Medical history non-contributory     Past Surgical History:  Procedure Laterality Date  . NO PAST SURGERIES      No family history on  file.  Social History   Tobacco Use  . Smoking status: Never Smoker  . Smokeless tobacco: Never Used  Substance Use Topics  . Alcohol use: Never  . Drug use: Never    Allergies: No Known Allergies  No medications prior to admission.    Review of Systems  Constitutional: Negative for chills, diaphoresis, fatigue and fever.  Eyes: Negative for visual disturbance.  Respiratory: Negative for shortness of breath.   Cardiovascular: Negative for chest pain.  Gastrointestinal: Negative for abdominal pain, constipation, diarrhea, nausea and vomiting.  Genitourinary: Positive for vaginal bleeding. Negative for dysuria, flank pain, frequency, pelvic pain, urgency and vaginal discharge.  Neurological: Negative for dizziness, weakness, light-headedness and headaches.   Physical Exam   Blood pressure 104/74, pulse 88, temperature 98.5 F (36.9 C), temperature source Oral, resp. rate 16, last menstrual period 06/21/2020, SpO2 98 %.  Patient Vitals for the past 24 hrs:  BP Temp Temp src Pulse Resp SpO2  07/30/20 2105 104/74 98.5 F (36.9 C) Oral 88 16 98 %   Physical Exam Constitutional:      General: She is not in acute distress.    Appearance: She is well-developed and well-nourished. She is not diaphoretic.  HENT:     Head:  Normocephalic and atraumatic.  Pulmonary:     Effort: Pulmonary effort is normal.  Skin:    General: Skin is warm and dry.  Neurological:     Mental Status: She is alert and oriented to person, place, and time.  Psychiatric:        Mood and Affect: Mood and affect normal.        Behavior: Behavior normal.        Thought Content: Thought content normal.        Judgment: Judgment normal.    Results for orders placed or performed during the hospital encounter of 07/30/20 (from the past 24 hour(s))  Urinalysis, Routine w reflex microscopic Urine, Clean Catch     Status: Abnormal   Collection Time: 07/30/20 12:25 PM  Result Value Ref Range   Color, Urine  YELLOW YELLOW   APPearance CLEAR CLEAR   Specific Gravity, Urine 1.010 1.005 - 1.030   pH 6.0 5.0 - 8.0   Glucose, UA NEGATIVE NEGATIVE mg/dL   Hgb urine dipstick LARGE (A) NEGATIVE   Bilirubin Urine NEGATIVE NEGATIVE   Ketones, ur NEGATIVE NEGATIVE mg/dL   Protein, ur NEGATIVE NEGATIVE mg/dL   Nitrite NEGATIVE NEGATIVE   Leukocytes,Ua MODERATE (A) NEGATIVE  Pregnancy, urine     Status: Abnormal   Collection Time: 07/30/20 12:25 PM  Result Value Ref Range   Preg Test, Ur POSITIVE (A) NEGATIVE  Urinalysis, Microscopic (reflex)     Status: Abnormal   Collection Time: 07/30/20 12:25 PM  Result Value Ref Range   RBC / HPF 11-20 0 - 5 RBC/hpf   WBC, UA 11-20 0 - 5 WBC/hpf   Bacteria, UA MANY (A) NONE SEEN   Squamous Epithelial / LPF 0-5 0 - 5  Comprehensive metabolic panel     Status: None   Collection Time: 07/30/20  2:20 PM  Result Value Ref Range   Sodium 136 135 - 145 mmol/L   Potassium 3.8 3.5 - 5.1 mmol/L   Chloride 103 98 - 111 mmol/L   CO2 24 22 - 32 mmol/L   Glucose, Bld 99 70 - 99 mg/dL   BUN 17 6 - 20 mg/dL   Creatinine, Ser 0.73 0.44 - 1.00 mg/dL   Calcium 9.2 8.9 - 10.3 mg/dL   Total Protein 7.5 6.5 - 8.1 g/dL   Albumin 4.3 3.5 - 5.0 g/dL   AST 20 15 - 41 U/L   ALT 15 0 - 44 U/L   Alkaline Phosphatase 39 38 - 126 U/L   Total Bilirubin 0.7 0.3 - 1.2 mg/dL   GFR, Estimated >60 >60 mL/min   Anion gap 9 5 - 15  CBC with Differential     Status: None   Collection Time: 07/30/20  2:20 PM  Result Value Ref Range   WBC 9.3 4.0 - 10.5 K/uL   RBC 4.66 3.87 - 5.11 MIL/uL   Hemoglobin 13.6 12.0 - 15.0 g/dL   HCT 42.1 36.0 - 46.0 %   MCV 90.3 80.0 - 100.0 fL   MCH 29.2 26.0 - 34.0 pg   MCHC 32.3 30.0 - 36.0 g/dL   RDW 12.9 11.5 - 15.5 %   Platelets 238 150 - 400 K/uL   nRBC 0.0 0.0 - 0.2 %   Neutrophils Relative % 77 %   Neutro Abs 7.1 1.7 - 7.7 K/uL   Lymphocytes Relative 16 %   Lymphs Abs 1.5 0.7 - 4.0 K/uL   Monocytes Relative 6 %   Monocytes Absolute 0.6  0.1  - 1.0 K/uL   Eosinophils Relative 1 %   Eosinophils Absolute 0.1 0.0 - 0.5 K/uL   Basophils Relative 0 %   Basophils Absolute 0.0 0.0 - 0.1 K/uL   Immature Granulocytes 0 %   Abs Immature Granulocytes 0.04 0.00 - 0.07 K/uL  hCG, quantitative, pregnancy     Status: Abnormal   Collection Time: 07/30/20  2:20 PM  Result Value Ref Range   hCG, Beta Chain, Quant, S 448 (H) <5 mIU/mL  ABO/Rh     Status: None   Collection Time: 07/30/20  2:20 PM  Result Value Ref Range   ABO/RH(D) O POS    No rh immune globuloin      NOT A RH IMMUNE GLOBULIN CANDIDATE, PT RH POSITIVE Performed at [facility], [place] [address], [place], [place] [address]    US OB LESS THAN 14 WEEKS WITH OB TRANSVAGINAL  Result Date: 07/30/2020 CLINICAL DATA:  Initial evaluation for acute vaginal bleeding, early pregnancy. EXAM: OBSTETRIC <14 WK [place] AND TRANSVAGINAL OB US TECHNIQUE: Both transabdominal and transvaginal ultrasound examinations were performed for complete evaluation of the gestation as well as the maternal uterus, adnexal regions, and pelvic cul-de-sac. Transvaginal technique was performed to assess early pregnancy. COMPARISON:  None available. FINDINGS: Intrauterine gestational sac: None definite. Subcentimeter cystic lesion within the endometrial complex noted on sin a clip, indeterminate, and could reflect a small subendometrial cyst. Yolk sac:  Negative. Embryo:  Negative. Cardiac Activity: Negative. Heart Rate: N/A Subchorionic hemorrhage:  None visualized. Maternal uterus/adnexae: Ovaries are within normal limits bilaterally, although the left ovary is somewhat difficult to visualize. No adnexal mass or free fluid. Probable 3.5 x 2.3 x 2.7 cm intramural fibroid present at the mid posterior uterine body. Underlying uterus is anteverted. IMPRESSION: 1. Early pregnancy with no discrete IUP or adnexal mass identified. Finding is consistent with a pregnancy of unknown anatomic location. Differential considerations  include IUP to early to visualize, recent SAB, or possibly occult ectopic pregnancy. Close clinical monitoring with serial beta HCGs and close interval follow-up ultrasound recommended as clinically warranted. 2. 3.5 cm intramural fibroid at the posterior uterine body. 3. No other acute maternal uterine or adnexal abnormality identified. Electronically Signed   By: Jeannine Boga M.D.   On: 07/30/2020 22:10    MAU Course  Procedures  MDM -r/o ectopic -UA: lg hgb/mod leuks/many bacteria, sending urine for culture -CBC: WNL -CMP: WNL -[place]: FINDINGS: PUL, Intrauterine gestational sac: None definite. Subcentimeter cystic lesion within the endometrial complex noted on sin a clip, indeterminate, and could reflect a small subendometrial cyst. Probable 3.5 x 2.3 x 2.7 cm intramural fibroid present at the mid posterior uterine body. Underlying uterus is anteverted. -hCG: 448 -ABO: O Positive -WetPrep: *** -GC/CT collected -Discussed with client the diagnosis of pregnancy of unknown anatomic location.  Three possibilities of outcome are: a healthy pregnancy that is too early to see a yolk sac to confirm the pregnancy is in the uterus, a pregnancy that is not healthy and has not developed and will not develop, and an ectopic pregnancy that is in the abdomen that cannot be identified at this time.  And ectopic pregnancy can be a life threatening situation as a pregnancy needs to be in the uterus which is a muscle and can stretch to accommodate the growth of a pregnancy.  Other structures in the pelvis and abdomen as not muscular and do not stretch with the growth of a pregnancy.  Worst case scenario  is that a structure ruptures with a growing pregnancy not in the uterus and and internal hemorrhage can be a life threatening situation.  We need to follow the progression of this pregnancy carefully.  We need to check another serum pregnancy hormone level to determine if the levels are rising appropriately  and  to determine the next steps that are needed for you. Patient's questions were answered. -pt discharged to home in stable condition  Orders Placed This Encounter  Procedures  . Culture, OB Urine    Standing Status:   Standing    Number of Occurrences:   1  . US OB LESS THAN 14 WEEKS WITH OB TRANSVAGINAL    Standing Status:   Standing    Number of Occurrences:   1    Order Specific Question:   Symptom/Reason for Exam    Answer:   Vaginal bleeding affecting early pregnancy [[id number]]  . Discharge patient    Order Specific Question:   Discharge disposition    Answer:   01-Home or Self Care [1]    Order Specific Question:   Discharge patient date    Answer:   07/30/2020   No orders of the defined types were placed in this encounter.   Assessment and Plan   1. Pregnancy of unknown anatomic location   2. Vaginal bleeding affecting early pregnancy   3. Blood type, Rh positive     Allergies as of 07/30/2020   No Known Allergies     Medication List    You have not been prescribed any medications.     -will call with culture results, if positive -safe meds in pregnancy list given -discussed ectopic vs. SAB vs. miscarriage -strict ectopic precautions given -return MAU precautions -f/u on 08/01/2020 at [facility] at [facility] for repeat hCG -pt discharged to home in stable condition  Joni Reining E Zahrah Sutherlin 07/30/2020, 11:55 PM

## 2020-08-01 ENCOUNTER — Ambulatory Visit (INDEPENDENT_AMBULATORY_CARE_PROVIDER_SITE_OTHER): Payer: Self-pay | Admitting: General Practice

## 2020-08-01 DIAGNOSIS — O3680X0 Pregnancy with inconclusive fetal viability, not applicable or unspecified: Secondary | ICD-10-CM

## 2020-08-01 LAB — BETA HCG QUANT (REF LAB): hCG Quant: 124 m[IU]/mL

## 2020-08-01 NOTE — Progress Notes (Signed)
Patient presents to office today for stat bhcg following up from MAU visit on 1/3. Patient seemed very frustrated with long ER wait times and feels if someone got to her sooner she wouldn't have miscarried. Patient reports heavy bleeding like a period while at ER but bleeding has since slowed down and is light. Patient states her doctor in [place] told her there was a medication she could take from the pharmacy that would strengthen her pregnancy and decrease likelihood of miscarriage. Patient is upset because she feels like if she didn't wait and we gave her the prescription this wouldn't have happened. Apologized to patient for her experience. Discussed bhcg process with patient at length including purpose of lab and expected values. Also discussed with patient that unfortunately once a miscarriage starts there isn't anything we can do or medication that can stop it from happening. Discussed some providers have different practices than [place] but there isn't research/evidence that suggests taking medication prevents a miscarriage. Patient verbalized understanding and reports miscarriage earlier this year but she didn't tell anyone about it. She feels certain that is what has happened again. Discussed with patient results take approximately 2 hours to finalize and will be reviewed with a provider. Stated I would then call her with results and updated plan of care. Suggested to patient if this was a miscarriage I recommend her scheduling a follow up appt with a doctor to discuss future pregnancies. Patient verbalized understanding and provided call back number [phone]. [employer] interpreter [phone] used for initial encounter.  Reviewed results with Edd Arbour who finds decreasing bhcg indicating miscarriage, patient should follow up with a MD to discuss future pregnancies.  Called patient with pacific interpreter [phone] and informed her of results. Discussed follow up visit with a doctor and someone from the  front office will call her with an appt. Patient verbalized understanding & asked if there was a pill or anything she needed to take to be sure she is cleaned out. Told patient no, the body takes care of that on its own most of the time. Discussed the doctor may want to check her pregnancy hormone levels again at follow up visit to ensure they have returned to 0. Patient verbalized understanding & had no other questions.  Chase Caller RN BSN 08/01/20

## 2020-08-02 LAB — CULTURE, OB URINE: Culture: >=100000 — AB

## 2020-08-02 NOTE — Progress Notes (Signed)
Patient was assessed and managed by nursing staff during this encounter. I have reviewed the chart and agree with the documentation and plan. While discussing with RN, I advised pt to return in one week for a provider visit with a repeat quant to be drawn at that visit.   Bernerd Limbo, CNM 08/02/2020 9:23 PM

## 2020-08-02 NOTE — Progress Notes (Signed)
Reassuring that patient had large drop in HCG, however without definitive visualization of the pregnancy this is a patient whose hcg we need to follow to normal. Please call her back and ask her to return for a repeat beta hcg in one week.

## 2020-08-07 ENCOUNTER — Telehealth: Payer: Self-pay | Admitting: Women's Health

## 2020-08-07 NOTE — Telephone Encounter (Signed)
Opened in error

## 2020-08-08 ENCOUNTER — Telehealth: Payer: Self-pay | Admitting: *Deleted

## 2020-08-08 MED ORDER — NITROFURANTOIN MONOHYD MACRO 100 MG PO CAPS: 100.0000 mg | ORAL_CAPSULE | Freq: Two times a day (BID) | ORAL | 0 refills | Status: DC

## 2020-08-08 NOTE — Telephone Encounter (Addendum)
-----   Message from Clarisa Fling, NP sent at 08/07/2020  5:30 PM EST ----- Good morning,  This patient was found to have a UTI on a visit to MAU but was not called or treated when she was in the office on 08/01/2020. Please call this patient to make her aware and treat her per protocol. Patient will need an Arabic interpreter.  Thank you, Elmyra Ricks  1/12  [place] pt w/[employer] interpreter [phone]. Pt was informed of UTI from test result during MAU visit on 1/3  which requires treatment with antibiotic. Pt had many questions regarding this test result. She did not understand what the bladder is and how she got the infection. We discussed UTI and that it is caused by bacteria which is foreign to the bladder. She denies all of the common symptoms of UTI and therefore was very confused. She also wanted to know if this infection had any bearing on her recent miscarriage or on future pregnancies. All of pt's questions were answered to her satisfaction and she agreed to plan of care. Dosage instructions were explained. She voiced understanding of all information and instructions given. Total time spent speaking with pt was 38 minutes.

## 2020-08-13 ENCOUNTER — Ambulatory Visit: Payer: Self-pay | Admitting: Obstetrics and Gynecology

## 2020-08-16 ENCOUNTER — Encounter: Payer: Self-pay | Admitting: Obstetrics & Gynecology

## 2020-08-16 ENCOUNTER — Ambulatory Visit (INDEPENDENT_AMBULATORY_CARE_PROVIDER_SITE_OTHER): Payer: Self-pay | Admitting: Obstetrics & Gynecology

## 2020-08-16 ENCOUNTER — Other Ambulatory Visit: Payer: Self-pay

## 2020-08-16 VITALS — BP 135/82 | HR 71 | Wt 117.0 lb

## 2020-08-16 DIAGNOSIS — O039 Complete or unspecified spontaneous abortion without complication: Secondary | ICD-10-CM

## 2020-08-16 DIAGNOSIS — Z789 Other specified health status: Secondary | ICD-10-CM

## 2020-08-16 NOTE — Progress Notes (Addendum)
GYNECOLOGY  VISIT  CC:   Follow up after likely miscarriage  HPI: 50 y.o. G2P0020 Single Other or two or more races female here for follow up after being seen in MAU after having a positive pregnancy test.  HCG was 124.  Ultrasound showed no IUD and normal ovaries.  She continued to have bleeding for another 9 days.  Blood type was O+.  Pt really does desire pregnancy.  She asks about cerclage and vitamins to help with pregnancy.  D/w pt that her age is the biggest factor and that cerclage is used for incompetent cervix which is not what has happened with her.  Donor egg discussed.  Pt states this is again her faith.    Denies urinary symptoms.  Reports discomfort with intercourse that has been with the miscarriage x 2.  Advised pt to wait until next regular cycle and attempt pregnancy again.  If still having pain, advised to call back.    She asked about vitamin Ovucure.  I reviewed this and discussed how it may increase ovulation but age is the biggest issue for her.  Ovulation predictor kits also discussion.  Options discussed.    GYNECOLOGIC HISTORY: Patient's last menstrual period was 06/21/2020.  There are no problems to display for this patient.   Past Medical History:  Diagnosis Date  . Medical history non-contributory     Past Surgical History:  Procedure Laterality Date  . NO PAST SURGERIES      MEDS:   Current Outpatient Medications on File Prior to Visit  Medication Sig Dispense Refill  . Prenatal Vit-Fe Fumarate-FA (PRENATAL PO) Take by mouth.     No current facility-administered medications on file prior to visit.    ALLERGIES: Patient has no known allergies.  No family history on file.  SH:  Single, non smoker  Review of Systems  All other systems reviewed and are negative.   PHYSICAL EXAMINATION:    BP 135/82   Pulse 71   Wt 117 lb (53.1 kg)   LMP 06/21/2020   Breastfeeding Unknown   BMI 19.73 kg/m     General appearance: alert, cooperative and  appears stated age No other exam performed today  Assessment/Plan:  1. Miscarriage - Beta hCG quant (ref lab).  Will follow to normal.  Repeat 1 week if not in normal range.  2. Health maintenance - information about B[facility]P program for pap smear and scholarship program for [facility] given to pt today.  3. Language barrier - translator used throughout visit  35 minutes of total time was spent for this patient encounter, including preparation, face-to-face counseling with the patient and coordination of care, and documentation of the encounter.

## 2020-08-16 NOTE — Addendum Note (Signed)
Addended by: Megan Salon on: 08/16/2020 05:42 PM   Modules accepted: Level of Service

## 2020-08-17 LAB — BETA HCG QUANT (REF LAB): hCG Quant: 1 m[IU]/mL

## 2020-08-20 ENCOUNTER — Telehealth: Payer: Self-pay

## 2020-08-20 NOTE — Telephone Encounter (Signed)
-----   Message from Megan Salon, MD sent at 08/17/2020  6:36 AM EST ----- Please let pt know her HCG is now back to normal.  She can start trying again, if she wants, after her next normal menstrual cycle.  Will need translator.  She asked many questions yesterday at her visit.  She is expecting this to be back to normal.

## 2020-08-20 NOTE — Telephone Encounter (Signed)
Called Pt using Arabic [employer] Interpreter [facility] id # [id number]  to advise of HCG being back to normal, pt wanted to know is there any medicines she needs to take, any [place] needed. Advised that she can start trying to get pregnant again after her next normal cycle. Pt verbalized understanding.

## 2020-08-27 ENCOUNTER — Ambulatory Visit: Payer: Self-pay | Admitting: Obstetrics and Gynecology

## 2020-09-17 ENCOUNTER — Other Ambulatory Visit: Payer: Self-pay | Admitting: Obstetrics and Gynecology

## 2020-09-17 DIAGNOSIS — Z1231 Encounter for screening mammogram for malignant neoplasm of breast: Secondary | ICD-10-CM

## 2020-09-19 ENCOUNTER — Telehealth: Payer: Self-pay | Admitting: Family Medicine

## 2020-09-19 ENCOUNTER — Telehealth: Payer: Self-pay

## 2020-09-19 NOTE — Telephone Encounter (Signed)
Called Pt using [place] id# [phone], Husband had phone was not with wife, asked if they could call when office reopens to schedule a Nurse visit. Husband states he will have her to cal in the am.

## 2020-09-19 NOTE — Telephone Encounter (Signed)
Arabic--pt spouse speaks [place]  And he is with pt-Pt states she is having pain when she urinates and after urinating, also  fever, body aches and needs to know if she needs to come into the office

## 2020-09-20 ENCOUNTER — Encounter: Payer: Self-pay | Admitting: Family Medicine

## 2020-09-21 ENCOUNTER — Ambulatory Visit (HOSPITAL_COMMUNITY)
Admission: EM | Admit: 2020-09-21 | Discharge: 2020-09-21 | Disposition: A | Discharge status: Home / Self Care | Payer: Self-pay | Attending: Student | Admitting: Student

## 2020-09-21 ENCOUNTER — Encounter (HOSPITAL_COMMUNITY): Payer: Self-pay

## 2020-09-21 ENCOUNTER — Other Ambulatory Visit: Payer: Self-pay

## 2020-09-21 DIAGNOSIS — Z3202 Encounter for pregnancy test, result negative: Secondary | ICD-10-CM | POA: Insufficient documentation

## 2020-09-21 DIAGNOSIS — R8290 Unspecified abnormal findings in urine: Secondary | ICD-10-CM

## 2020-09-21 DIAGNOSIS — R509 Fever, unspecified: Secondary | ICD-10-CM

## 2020-09-21 DIAGNOSIS — Z1152 Encounter for screening for COVID-19: Secondary | ICD-10-CM | POA: Insufficient documentation

## 2020-09-21 DIAGNOSIS — R52 Pain, unspecified: Secondary | ICD-10-CM

## 2020-09-21 DIAGNOSIS — N10 Acute tubulo-interstitial nephritis: Secondary | ICD-10-CM | POA: Insufficient documentation

## 2020-09-21 DIAGNOSIS — N3001 Acute cystitis with hematuria: Secondary | ICD-10-CM | POA: Insufficient documentation

## 2020-09-21 LAB — POCT URINALYSIS DIPSTICK, ED / UC
Glucose, UA: NEGATIVE mg/dL
Ketones, ur: >=160 mg/dL — AB
Nitrite: NEGATIVE
Protein, ur: 30 mg/dL — AB
Specific Gravity, Urine: 1.02 (ref 1.005–1.030)
Urobilinogen, UA: 0.2 mg/dL (ref 0.0–1.0)
pH: 6 (ref 5.0–8.0)

## 2020-09-21 LAB — POC URINE PREG, ED: Preg Test, Ur: NEGATIVE

## 2020-09-21 MED ORDER — SULFAMETHOXAZOLE-TRIMETHOPRIM 800-160 MG PO TABS: 1.0000 | ORAL_TABLET | Freq: Two times a day (BID) | ORAL | 0 refills | Status: AC

## 2020-09-21 NOTE — ED Provider Notes (Signed)
[name]: [id number] Arrival date & time: 09/21/20  1002      History   Chief Complaint Chief Complaint  Patient presents with  . Generalized Body Aches  . Fever  . Chills  . dark urine    HPI Chloe Tyler is a 50 y.o. female presenting for urinary symptoms. History of UTIs in the past.  States she has had 1 week of fevers/body aches/chills, R flank pain, dysuria, dark urine.  States flank pain is improving on its own.  Denies new partners, STI symptoms. Denies hematuria, frequency, urgency,n/v/d, abdnormal vaginal discharge. Denies URI symptoms- denies cough, fevers/chills, congestion.    HPI  Past Medical History:  Diagnosis Date  . Medical history non-contributory     There are no problems to display for this patient.   Past Surgical History:  Procedure Laterality Date  . NO PAST SURGERIES      OB History    Gravida  2   Para  0   Term  0   Preterm  0   AB  2   Living  0     SAB  2   IAB  0   Ectopic  0   Multiple  0   Live Births  0            Home Medications    Prior to Admission medications   Medication Sig Start Date End Date Taking? Authorizing Provider  sulfamethoxazole-trimethoprim (BACTRIM DS) 800-160 MG tablet Take 1 tablet by mouth 2 (two) times daily for 7 days. 09/21/20 09/28/20 Yes Hazel Sams, PA-C  Prenatal Vit-Fe Fumarate-FA (PRENATAL PO) Take by mouth.    [provider]    Family History History reviewed. No pertinent family history.  Social History Social History   Tobacco Use  . Smoking status: Never Smoker  . Smokeless tobacco: Never Used  Substance Use Topics  . Alcohol use: Never  . Drug use: Never     Allergies   Patient has no known allergies.   Review of Systems Review of Systems  Constitutional: Negative for appetite change, chills, diaphoresis and fever.  Respiratory: Negative for shortness of breath.   Cardiovascular: Negative for chest pain.   Gastrointestinal: Negative for abdominal pain, blood in stool, constipation, diarrhea, nausea and vomiting.  Genitourinary: Positive for dysuria and flank pain. Negative for decreased urine volume, difficulty urinating, frequency, genital sores, hematuria, urgency, vaginal bleeding, vaginal discharge and vaginal pain.  Musculoskeletal: Negative for back pain.  Neurological: Negative for dizziness, weakness and light-headedness.  All other systems reviewed and are negative.    Physical Exam Triage Vital Signs ED Triage Vitals  Enc Vitals Group     BP 09/21/20 1059 97/66     Pulse Rate 09/21/20 1059 81     Resp 09/21/20 1059 18     Temp 09/21/20 1059 98.9 F (37.2 C)     Temp src --      SpO2 09/21/20 1059 100 %     Weight --      Height --      Head Circumference --      Peak Flow --      Pain Score 09/21/20 1056 4     Pain Loc --      Pain Edu? --      Excl. in [place]? --    No data found.  Updated Vital Signs BP 97/66   Pulse 81   Temp 98.9 F (37.2  C)   Resp 18   LMP 09/07/2020   SpO2 100%   Breastfeeding No   Visual Acuity Right Eye Distance:   Left Eye Distance:   Bilateral Distance:    Right Eye Near:   Left Eye Near:    Bilateral Near:     Physical Exam Vitals reviewed.  Constitutional:      General: She is not in acute distress.    Appearance: Normal appearance. She is not ill-appearing.  HENT:     Head: Normocephalic and atraumatic.  Cardiovascular:     Rate and Rhythm: Normal rate and regular rhythm.     Heart sounds: Normal heart sounds.  Pulmonary:     Effort: Pulmonary effort is normal.     Breath sounds: Normal breath sounds. No wheezing, rhonchi or rales.  Abdominal:     General: Bowel sounds are normal. There is no distension.     Palpations: Abdomen is soft. There is no mass.     Tenderness: There is abdominal tenderness in the right upper quadrant. There is right CVA tenderness. There is no left CVA tenderness, guarding or rebound.  Negative signs include Murphy's sign, Rovsing's sign and McBurney's sign.  Neurological:     General: No focal deficit present.     Mental Status: She is alert and oriented to person, place, and time.  Psychiatric:        Mood and Affect: Mood normal.        Behavior: Behavior normal.      UC Treatments / Results  Labs (all labs ordered are listed, but only abnormal results are displayed) Labs Reviewed  POCT URINALYSIS DIPSTICK, ED / UC - Abnormal; Notable for the following components:      Result Value   Bilirubin Urine SMALL (*)    Ketones, ur >=160 (*)    Hgb urine dipstick LARGE (*)    Protein, ur 30 (*)    Leukocytes,Ua SMALL (*)    All other components within normal limits  URINE CULTURE  SARS CORONAVIRUS 2 (TAT 6-24 HRS)  POC URINE PREG, ED    EKG   Radiology No results found.  Procedures Procedures (including critical care time)  Medications Ordered in UC Medications - No data to display  Initial Impression / Assessment and Plan / UC Course  I have reviewed the triage vital signs and the nursing notes.  Pertinent labs & imaging results that were available during my care of the patient were reviewed by me and considered in my medical decision making (see chart for details).     This patient is a 50 year old female presenting with acute Pyelonephritis.  Today she is afebrile, nontachycardic,  nontachypneic.  UA today with large blood, small leuk. Culture sent. R-sided CVAT.  Urine pregnancy negative.   For acute pyelo, bactim sent as below. Creatinine in normal range 1 month ago. rec good hydration.   Covid test sent at patient request.   Patient is without PCP; referral placed. She requests that arabic interpreter be used when they contact her.  Using language line, spent over 40 minutes obtaining H&P, performing physical, discussing results, treatment plan and plan for follow-up with patient. Patient agrees with plan.   Final Clinical Impressions(s)  / UC Diagnoses   Final diagnoses:  Acute cystitis with hematuria  Negative pregnancy test  Encounter for screening for COVID-19  Acute pyelonephritis     Discharge Instructions     -For UTI, start the antibiotic-Bactrim (sulfamethoxazole-trimethoprim), 2 pills daily for  7 days. -Make sure to drink plenty of water. -Your pregnancy test was negative today. -We have sent a Covid test.  This test should come back in about 1 day.  We will call you if this is positive, otherwise it will go straight to email and  mychart.    ED Prescriptions    Medication Sig Dispense Auth. Provider   sulfamethoxazole-trimethoprim (BACTRIM DS) 800-160 MG tablet Take 1 tablet by mouth 2 (two) times daily for 7 days. 14 tablet Hazel Sams, PA-C     PDMP not reviewed this encounter.   Hazel Sams, PA-C 09/21/20 1204

## 2020-09-21 NOTE — Discharge Instructions (Addendum)
-  For UTI, start the antibiotic-Bactrim (sulfamethoxazole-trimethoprim), 2 pills daily for 7 days. -Make sure to drink plenty of water. -Your pregnancy test was negative today. -We have sent a Covid test.  This test should come back in about 1 day.  We will call you if this is positive, otherwise it will go straight to email and  mychart.

## 2020-09-21 NOTE — ED Triage Notes (Signed)
Pt in with c/o subjective fever, cough, body aches, chills and flank pain that has been going on since Sunday. Also c/o dark urine and discomfort when she urinates.   Pt has taken ibuprofen for sxs

## 2020-09-22 LAB — URINE CULTURE: Culture: NO GROWTH

## 2020-09-22 LAB — SARS CORONAVIRUS 2 (TAT 6-24 HRS): SARS Coronavirus 2: NEGATIVE

## 2020-10-09 ENCOUNTER — Ambulatory Visit (HOSPITAL_COMMUNITY)
Admission: EM | Admit: 2020-10-09 | Discharge: 2020-10-09 | Disposition: A | Discharge status: Other Acute Inpatient Hospital | Payer: Self-pay

## 2020-10-09 ENCOUNTER — Ambulatory Visit (INDEPENDENT_AMBULATORY_CARE_PROVIDER_SITE_OTHER): Payer: Self-pay

## 2020-10-09 ENCOUNTER — Inpatient Hospital Stay (HOSPITAL_COMMUNITY)
Admission: AD | Admit: 2020-10-09 | Discharge: 2020-10-09 | Disposition: A | Discharge status: Home / Self Care | Payer: Self-pay | Attending: Obstetrics and Gynecology | Admitting: Obstetrics and Gynecology

## 2020-10-09 ENCOUNTER — Other Ambulatory Visit: Payer: Self-pay

## 2020-10-09 ENCOUNTER — Other Ambulatory Visit (HOSPITAL_COMMUNITY)
Admission: RE | Admit: 2020-10-09 | Discharge: 2020-10-09 | Disposition: A | Discharge status: Home / Self Care | Payer: Self-pay | Source: Ambulatory Visit

## 2020-10-09 VITALS — BP 112/73 | HR 60 | Ht 62.0 in | Wt 118.1 lb

## 2020-10-09 DIAGNOSIS — N939 Abnormal uterine and vaginal bleeding, unspecified: Secondary | ICD-10-CM | POA: Insufficient documentation

## 2020-10-09 DIAGNOSIS — Z789 Other specified health status: Secondary | ICD-10-CM

## 2020-10-09 DIAGNOSIS — Z3202 Encounter for pregnancy test, result negative: Secondary | ICD-10-CM | POA: Insufficient documentation

## 2020-10-09 LAB — POCT PREGNANCY, URINE: Preg Test, Ur: NEGATIVE

## 2020-10-09 NOTE — Progress Notes (Signed)
Here for follow up from miscarriage in January. States has had 2 periods since then on 2/8 and 2/26 and still bleeding. Wants to know why bleeding Delrae Hagey,RN

## 2020-10-09 NOTE — Patient Instructions (Signed)
WillPerimenopause Perimenopause is the normal time of a woman's life when the levels of estrogen, the female hormone produced by the ovaries, begin to decrease. This leads to changes in menstrual periods before they stop completely (menopause). Perimenopause can begin 2-8 years before menopause. During perimenopause, the ovaries may or may not produce an egg and a woman can still become pregnant. What are the causes? This condition is caused by a natural change in hormone levels that happens as you get older. What increases the risk? This condition is more likely to start at an earlier age if you have certain medical conditions or have undergone treatments, including:  A tumor of the pituitary gland in the brain.  A disease that affects the ovaries and hormone production.  Certain cancer treatments, such as chemotherapy or hormone therapy, or radiation therapy on the pelvis.  Heavy smoking and excessive alcohol use.  Family history of early menopause. What are the signs or symptoms? Perimenopausal changes affect each woman differently. Symptoms of this condition may include:  Hot flashes.  Irregular menstrual periods.  Night sweats.  Changes in feelings about sex. This could be a decrease in sex drive or an increased discomfort around your sexuality.  Vaginal dryness.  Headaches.  Mood swings.  Depression.  Problems sleeping (insomnia).  Memory problems or trouble concentrating.  Irritability.  Tiredness.  Weight gain.  Anxiety.  Trouble getting pregnant. How is this diagnosed? This condition is diagnosed based on your medical history, a physical exam, your age, your menstrual history, and your symptoms. Hormone tests may also be done. How is this treated? In some cases, no treatment is needed. You and your health care provider should make a decision together about whether treatment is necessary. Treatment will be based on your individual condition and preferences.  Various treatments are available, such as:  Menopausal hormone therapy (MHT).  Medicines to treat specific symptoms.  Acupuncture.  Vitamin or herbal supplements. Before starting treatment, make sure to let your health care provider know if you have a personal or family history of:  Heart disease.  Breast cancer.  Blood clots.  Diabetes.  Osteoporosis. Follow these instructions at home: Medicines  Take over-the-counter and prescription medicines only as told by your health care provider.  Take vitamin supplements only as told by your health care provider.  Talk with your health care provider before starting any herbal supplements. Lifestyle  Do not use any products that contain nicotine or tobacco, such as cigarettes, e-cigarettes, and chewing tobacco. If you need help quitting, ask your health care provider.  Get at least 30 minutes of physical activity on 5 or more days each week.  Eat a balanced diet that includes fresh fruits and vegetables, whole grains, soybeans, eggs, lean meat, and low-fat dairy.  Avoid alcoholic and caffeinated beverages, as well as spicy foods. This may help prevent hot flashes.  Get 7-8 hours of sleep each night.  Dress in layers that can be removed to help you manage hot flashes.  Find ways to manage stress, such as deep breathing, meditation, or journaling.   General instructions  Keep track of your menstrual periods, including: ? When they occur. ? How heavy they are and how long they last. ? How much time passes between periods.  Keep track of your symptoms, noting when they start, how often you have them, and how long they last.  Use vaginal lubricants or moisturizers to help with vaginal dryness and improve comfort during sex.  You can still  become pregnant if you are having irregular periods. Make sure you use contraception during perimenopause if you do not want to get pregnant.  Keep all follow-up visits. This is important.  This includes any group therapy or counseling.   Contact a health care provider if:  You have heavy vaginal bleeding or pass blood clots.  Your period lasts more than 2 days longer than normal.  Your periods are recurring sooner than 21 days.  You bleed after having sex.  You have pain during sex. Get help right away if you have:  Chest pain, trouble breathing, or trouble talking.  Severe depression.  Pain when you urinate.  Severe headaches.  Vision problems. Summary  Perimenopause is the time when a woman's body begins to move into menopause. This may happen naturally or as a result of other health problems or medical treatments.  Perimenopause can begin 2-8 years before menopause, and it can last for several years.  Perimenopausal symptoms can be managed through medicines, lifestyle changes, and complementary therapies such as acupuncture. This information is not intended to replace advice given to you by your health care provider. Make sure you discuss any questions you have with your health care provider. Document Revised: 12/29/2019 Document Reviewed: 12/29/2019 Elsevier Patient Education  2021 [place]. Abnormal Uterine Bleeding Abnormal uterine bleeding means bleeding more than usual from your womb (uterus). It can include:  Bleeding between menstrual periods.  Bleeding after sex.  Bleeding that is heavier than normal.  Menstrual periods that last longer than usual.  Bleeding after you have stopped having your menstrual period (menopause). There are many problems that may cause this. You should see a doctor for any kind of bleeding that is not normal. Treatment depends on the cause of the bleeding. Follow these instructions at home: Medicines  Take over-the-counter and prescription medicines only as told by your doctor.  Tell your doctor about other medicines that you take. ? If told by your doctor, stop taking aspirin or medicines that have aspirin in  them. These medicines can make you bleed more.  You may be given iron pills to replace iron that your body loses because of this condition. Take them as told by your doctor. Managing constipation If you are taking iron pills, you may have trouble pooping (constipation). To prevent or treat trouble pooping, you may need to:  Drink enough fluid to keep your pee (urine) pale yellow.  Take over-the-counter or prescription medicines.  Eat foods that are high in fiber. These include beans, whole grains, and fresh fruits and vegetables.  Limit foods that are high in fat and sugar. These include fried or sweet foods. General instructions  Watch your condition for any changes.  Do not use tampons, douche, or have sex, if your doctor tells you not to.  Change your pads often.  Get regular exams. This includes pelvic exams and cervical cancer screenings. ? It is up to you to get the results of any tests that are done. Ask your doctor, or the department that is doing the tests, when your results will be ready.  Keep all follow-up visits as told by your doctor. This is important. Contact a doctor if:  The bleeding lasts more than 1 week.  You feel dizzy at times.  You feel like you may vomit (nausea).  You vomit.  You feel light-headed or weak.  Your symptoms get worse. Get help right away if:  You pass out.  You have to change pads every  hour.  You have pain in your belly.  You have a fever or chills.  You get sweaty.  You get weak.  You pass large blood clots from your vagina. Summary  Abnormal uterine bleeding means bleeding more than usual from your womb (uterus).  Any kind of bleeding that is not normal should be checked by a doctor.  Treatment depends on the cause of the bleeding.  Get help right away if you pass out, you have to change pads every hour, or you pass large blood clots from your vagina. This information is not intended to replace advice given to  you by your health care provider. Make sure you discuss any questions you have with your health care provider. Document Revised: 05/17/2019 Document Reviewed: 05/17/2019 Elsevier Patient Education  [place].

## 2020-10-09 NOTE — MAU Provider Note (Signed)
Event Date/Time   First Provider Initiated Contact with Patient 10/09/20 1224      S [name] is a 50 y.o. G2P0020 patient who presents to MAU today with complaint of continued vaginal bleeding for past 22 days. Patient reports the bleeding is light and she only uses 1-2 pads per day, but is concerned about the duration of the bleeding. She has been to Urgent Care and now MAU today. UPT negative in MAU. Patient had SAB about 08/07/2020 and had f/u on in office on 08/16/2020 with serum hCG 1. Arabic interpreter used for entire visit, husband present for entire visit.   O BP 128/74 (BP Location: Right Arm)   Pulse 62   Temp 98 F (36.7 C) (Oral)   Resp 20   Wt 54.4 kg   LMP 09/25/2019   SpO2 99%   BMI 20.22 kg/m    Patient Vitals for the past 24 hrs:  BP Temp Temp src Pulse Resp SpO2 Weight  10/09/20 1205 128/74 98 F (36.7 C) Oral 62 20 99 % --  10/09/20 1156 -- -- -- -- -- -- 54.4 kg   Physical Exam Vitals and nursing note reviewed.  Constitutional:      General: She is not in acute distress.    Appearance: Normal appearance. She is not ill-appearing, toxic-appearing or diaphoretic.  HENT:     Head: Normocephalic and atraumatic.  Pulmonary:     Effort: Pulmonary effort is normal.  Neurological:     Mental Status: She is alert and oriented to person, place, and time.  Psychiatric:        Mood and Affect: Mood normal.        Behavior: Behavior normal.        Thought Content: Thought content normal.        Judgment: Judgment normal.    A Medical screening exam complete UPT negative Irregular vaginal bleeding  P Discharge from MAU in stable condition Appt made for patient at [facility] today for 215PM, provider notified List of options for follow-up given Warning signs for worsening condition that would warrant emergency follow-up discussed Patient may return to MAU as needed   Wrigley Plasencia, Gerrie Nordmann, NP 10/09/2020 12:49 PM

## 2020-10-09 NOTE — MAU Note (Signed)
Presents with c/o VB.  Reports had a miscarriage in January.  Reports had menstrual cycle in February on the 8th and again on February 28th and she's still bleeding.  Reports VB isn't heavy, but changing pads one to two times per day.

## 2020-10-09 NOTE — ED Triage Notes (Signed)
Per provider Colletta [place] request Pt was referred to [facility] hospital for increased vag bleeding with recent  Spontaneous abortion.

## 2020-10-09 NOTE — Progress Notes (Signed)
GYNECOLOGY PROBLEM OFFICE VISIT NOTE  History:  Chloe Tyler is a 50 y.o. G2P0020 here today for prolonged bleeding. She states she has been bleeding since her LMP of Sep 22, 2020.  She states the bleeding is minimal requiring pantyliner usage and changes them about 3x/day.  She reports some cramping, but none currently.  She does not use any medications when the cramping does occur.  Patient denies passing large blood clots.  She states the blood is dark red in color. She does not recall any vaginal discharge prior to the bleeding. She denies pelvic pain, but reports some pain during sex after her miscarriage.  Patient has not had sex recently due to the bleeding. She denies pain or difficulty with urination.   Past Medical History:  Diagnosis Date  . Medical history non-contributory   . UTI (urinary tract infection)     Past Surgical History:  Procedure Laterality Date  . NO PAST SURGERIES      The following portions of the patient's history were reviewed and updated as appropriate: allergies, current medications, past family history, past medical history, past social history, past surgical history and problem list.   Health Maintenance:  No pap smear on file.  No mammogram on file, patient states she has an appt for Pap and Mammogram on 3/24 at [facility].   Review of Systems:  Genito-Urinary ROS: no dysuria, trouble voiding, or hematuria Gastrointestinal ROS: negative Objective:  Vitals: BP 112/73   Pulse 60   Ht 5\' 2"  (1.575 m)   Wt 118 lb 1.6 oz (53.6 kg)   LMP 09/22/2020   BMI 21.60 kg/m   Physical Exam: Physical Exam Constitutional:      General: She is not in acute distress.    Appearance: Normal appearance. She is not toxic-appearing.  Genitourinary:     Vaginal bleeding (Scant) present.     Vaginal exam comments: CV collected.      Right Adnexa: tender.    Cervix is nulliparous.     Cervical discharge (Reddish yellow mucoid discharge) present.     No cervical  motion tenderness, friability, polyp, nabothian cyst or eversion.     Cervical exam comments: Displaced to right..     Uterus is enlarged (C/w 6-8 wk size) and tender (Fundal).  HENT:     Head: Normocephalic and atraumatic.  Eyes:     Conjunctiva/sclera: Conjunctivae normal.  Cardiovascular:     Rate and Rhythm: Normal rate.  Pulmonary:     Effort: Pulmonary effort is normal. No respiratory distress.  Abdominal:     General: Abdomen is flat.     Palpations: Abdomen is soft.     Tenderness: There is abdominal tenderness.  Musculoskeletal:        General: Normal range of motion.     Cervical back: Normal range of motion.  Neurological:     Mental Status: She is alert and oriented to person, place, and time.  Skin:    General: Skin is warm and dry.  Psychiatric:        Mood and Affect: Mood normal.        Behavior: Behavior normal.        Thought Content: Thought content normal.  Vitals reviewed. Exam conducted with a chaperone present.      Labs and Imaging: No results found.  Assessment & Plan:  50 year old Abnormal Uterine Bleeding Language Barrier Known Uterine Fibroids  -Extensive discussion regarding possible causes of AUB including perimenopausal, fibroids, and  vaginal infections.  -Patient expresses desire for [place] and informed that this would be an out-of-pocket cost.  Encouraged to wait until all results, including pap smear, to return before scheduling. -Discussed that POCs are highly unlikely to be present and explained that most recent UPT was negative and prior hCG was <1. Chloe Basta, RN was able to briefly review financial assistance information with patient. -Patient states that she will wait to obtain TVUS and encouraged to call office if bleeding and tenderness continues for >2 weeks. -CV collected and pending. -Chloe Tyler present for in-person interpretations. -Patient to keep scheduled [facility]CP appt for 3/24.   Total face-to-face time with patient: 30  minutes   Chloe Tyler, [place] 10/09/2020 2:32 PM

## 2020-10-10 LAB — CERVICOVAGINAL ANCILLARY ONLY
Bacterial Vaginitis (gardnerella): NEGATIVE
Candida Glabrata: NEGATIVE
Candida Vaginitis: NEGATIVE
Chlamydia: NEGATIVE
Comment: NEGATIVE
Comment: NEGATIVE
Comment: NEGATIVE
Comment: NEGATIVE
Comment: NEGATIVE
Comment: NORMAL
Neisseria Gonorrhea: NEGATIVE
Trichomonas: NEGATIVE

## 2020-10-18 ENCOUNTER — Ambulatory Visit: Payer: No Typology Code available for payment source | Admitting: *Deleted

## 2020-10-18 ENCOUNTER — Other Ambulatory Visit: Payer: Self-pay

## 2020-10-18 ENCOUNTER — Ambulatory Visit
Admission: RE | Admit: 2020-10-18 | Discharge: 2020-10-18 | Disposition: A | Discharge status: Home / Self Care | Payer: No Typology Code available for payment source | Source: Ambulatory Visit | Attending: Obstetrics and Gynecology | Admitting: Obstetrics and Gynecology

## 2020-10-18 VITALS — BP 126/86 | Wt 120.5 lb

## 2020-10-18 DIAGNOSIS — Z01419 Encounter for gynecological examination (general) (routine) without abnormal findings: Secondary | ICD-10-CM

## 2020-10-18 DIAGNOSIS — Z1231 Encounter for screening mammogram for malignant neoplasm of breast: Secondary | ICD-10-CM

## 2020-10-18 NOTE — Progress Notes (Signed)
Ms. Klee Kolek is a 50 y.o. G2P0020 female who presents to [facility] clinic today with no complaints.    Pap Smear: Pap smear completed today. Per patient has never had a Pap smear completed. Last Pap smear result is not available in Epic.   Physical exam: Breasts Breasts symmetrical. No skin abnormalities bilateral breasts. No nipple retraction bilateral breasts. No nipple discharge bilateral breasts. No lymphadenopathy. No lumps palpated bilateral breasts. No complaints of pain or tenderness on exam.       Pelvic/Bimanual Ext Genitalia No lesions, no swelling and no discharge observed on external genitalia.        Vagina Vagina pink and normal texture. No lesions or discharge observed in vagina.        Cervix Cervix is present. Cervix pink and of normal texture. Cervix tilted to the right. No discharge observed.    Uterus Uterus is present and palpable. Uterus in normal position and normal size.        Adnexae Bilateral ovaries present and palpable. No tenderness on palpation.         Rectovaginal No rectal exam completed today since patient had no rectal complaints. No skin abnormalities observed on exam.     Smoking History: Patient is a current hookah smoker. Discussed smoking cessation with patient. Referred to the [facility] Quitline and gave resources to the free smoking cessation classes at [facility].    Patient Navigation: Patient education provided. Access to services provided for patient through [employer] program. Used Arabic interpreter [facility] provided.   Colorectal Cancer Screening: Per patient has never had colonoscopy completed. No complaints today.    Breast and Cervical Cancer Risk Assessment: Patient does not have family history of breast cancer, known genetic mutations, or radiation treatment to the chest before age 45. Patient does not have history of cervical dysplasia, immunocompromised, or DES exposure in-utero.  Risk Assessment    Risk Scores      10/18/2020    Last edited by: Royston Bake, CMA   5-year risk: 1 %   Lifetime risk: 10.2 %          A: BCCCP exam with pap smear No complaints.  P: Referred patient to the [facility] for a screening mammogram on the mobile unit. Appointment scheduled Thursday, October 18, 2020 at 1400.  Loletta Parish, RN 10/18/2020 1:29 PM

## 2020-10-18 NOTE — Patient Instructions (Signed)
Explained breast self awareness with Chloe Tyler. Pap smear completed today. Let patient know that her next Pap smear will be due in 3 years due to no history of having a Pap smear. Referred patient to the [facility] for a screening mammogram on the mobile unit. Appointment scheduled Thursday, October 18, 2020 at 1400. Patient escorted to the mobile unit following [facility] appointment for her screening mammogram. Let patient know will follow up with her within the next couple weeks with results of Pap smear by letter or phone. Informed patient that the [facility] will follow-up with her within the next couple of weeks with results of her mammogram by letter or phone. Discussed smoking cessation with patient. Referred to the [facility] and gave resources to the free smoking cessation classes at [facility]. Chloe Tyler verbalized understanding.  [name], Arvil Chaco, RN 1:29 PM

## 2020-10-22 LAB — CYTOLOGY - PAP
Comment: NEGATIVE
Diagnosis: NEGATIVE
High risk HPV: NEGATIVE

## 2020-10-23 ENCOUNTER — Telehealth: Payer: Self-pay

## 2020-10-23 NOTE — Telephone Encounter (Signed)
Called patient to give pap smear results. Pap smear is normal and HPV negative. Next pap smear will be due in 3 years.

## 2021-01-26 ENCOUNTER — Other Ambulatory Visit: Payer: Self-pay

## 2021-01-26 ENCOUNTER — Ambulatory Visit: Payer: No Typology Code available for payment source | Admitting: Internal Medicine

## 2021-01-26 ENCOUNTER — Encounter: Payer: Self-pay | Admitting: Internal Medicine

## 2021-01-26 VITALS — BP 113/84 | HR 89 | Temp 99.3°F | Ht 63.39 in | Wt 116.0 lb

## 2021-01-26 DIAGNOSIS — R109 Unspecified abdominal pain: Secondary | ICD-10-CM | POA: Insufficient documentation

## 2021-01-26 DIAGNOSIS — N926 Irregular menstruation, unspecified: Secondary | ICD-10-CM

## 2021-01-26 NOTE — Progress Notes (Signed)
Acute Office Visit  Subjective:    Patient ID: Chloe Tyler, female    DOB: [birth year], 50 y.o.   MRN: [id number]  No chief complaint on file.   HPI Patient is in today for an advice/wu for getting pregnant.  Past Medical History:  Diagnosis Date   Medical history non-contributory    UTI (urinary tract infection)     Past Surgical History:  Procedure Laterality Date   NO PAST SURGERIES      Family History  Problem Relation Age of Onset   Hypertension Mother     Social History   Socioeconomic History   Marital status: Married    Spouse name: Not on file   Number of children: Not on file   Years of education: Not on file   Highest education level: Not on file  Occupational History   Not on file  Tobacco Use   Smoking status: Some Days    Pack years: 0.00   Smokeless tobacco: Never   Tobacco comments:    hookah  Vaping Use   Vaping Use: Never used  Substance and Sexual Activity   Alcohol use: Never   Drug use: Never   Sexual activity: Yes    Birth control/protection: None  Other Topics Concern   Not on file  Social History Narrative   Not on file   Social Determinants of Health   Financial Resource Strain: Not on file  Food Insecurity: No Food Insecurity   Worried About Charity fundraiser in the Last Year: Never true   Ran Out of Food in the Last Year: Never true  Transportation Needs: Unmet Transportation Needs   Lack of Transportation (Medical): Yes   Lack of Transportation (Non-Medical): Yes  Physical Activity: Not on file  Stress: Not on file  Social Connections: Not on file  Intimate Partner Violence: Not on file    Outpatient Medications Prior to Visit  Medication Sig Dispense Refill   Prenatal Vit-Fe Fumarate-FA (PRENATAL PO) Take by mouth.     No facility-administered medications prior to visit.    No Known Allergies  Review of Systems     Objective:    Physical Exam  BP 113/84 (BP Location: Right Arm, Patient Position:  Sitting)   Pulse 89   Temp 99.3 F (37.4 C)   Ht 5' 3.39" (1.61 m)   Wt 116 lb (52.6 kg)   SpO2 97%   BMI 20.30 kg/m  Wt Readings from Last 3 Encounters:  01/26/21 116 lb (52.6 kg)  10/18/20 120 lb 8 oz (54.7 kg)  10/09/20 118 lb 1.6 oz (53.6 kg)    Health Maintenance Due  Topic Date Due   COVID-19 Vaccine (1) Never done   Pneumococcal Vaccine 38-62 Years old (1 - PCV) Never done   HIV Screening  Never done   Hepatitis C Screening  Never done   TETANUS/TDAP  Never done   COLONOSCOPY (Pts 45-73yrs Insurance coverage will need to be confirmed)  Never done    There are no preventive care reminders to display for this patient.   No results found for: TSH Lab Results  Component Value Date   WBC 9.3 07/30/2020   HGB 13.6 07/30/2020   HCT 42.1 07/30/2020   MCV 90.3 07/30/2020   PLT 238 07/30/2020   Lab Results  Component Value Date   NA 136 07/30/2020   K 3.8 07/30/2020   CO2 24 07/30/2020   GLUCOSE 99 07/30/2020   BUN 17 07/30/2020  CREATININE 0.73 07/30/2020   BILITOT 0.7 07/30/2020   ALKPHOS 39 07/30/2020   AST 20 07/30/2020   ALT 15 07/30/2020   PROT 7.5 07/30/2020   ALBUMIN 4.3 07/30/2020   CALCIUM 9.2 07/30/2020   ANIONGAP 9 07/30/2020   No results found for: CHOL No results found for: HDL No results found for: LDLCALC No results found for: TRIG No results found for: CHOLHDL No results found for: HGBA1C     Assessment & Plan:   Problem List Items Addressed This Visit       Other   Stomach pain    External referral to ob/gyn done   No orders of the defined types were placed in this encounter.    Wallene Huh, MD

## 2021-01-28 ENCOUNTER — Other Ambulatory Visit: Payer: Self-pay | Admitting: Internal Medicine

## 2021-01-30 LAB — NOVEL CORONAVIRUS, NAA: SARS-CoV-2, NAA: NOT DETECTED

## 2021-01-30 LAB — SPECIMEN STATUS REPORT

## 2021-01-30 LAB — SARS-COV-2, NAA 2 DAY TAT

## 2021-05-16 IMAGING — MG MM DIGITAL SCREENING BILAT W/ TOMO AND CAD
8 series · 8 of 24 positions shown · non-contrast
Comparison: None.

CLINICAL DATA: Screening.

EXAM:
DIGITAL SCREENING BILATERAL MAMMOGRAM WITH TOMOSYNTHESIS AND CAD
TECHNIQUE: Bilateral screening digital craniocaudal and mediolateral oblique
mammograms were obtained. Bilateral screening digital breast
tomosynthesis was performed. The images were evaluated with
computer-aided detection.

[R CC synth-2D]
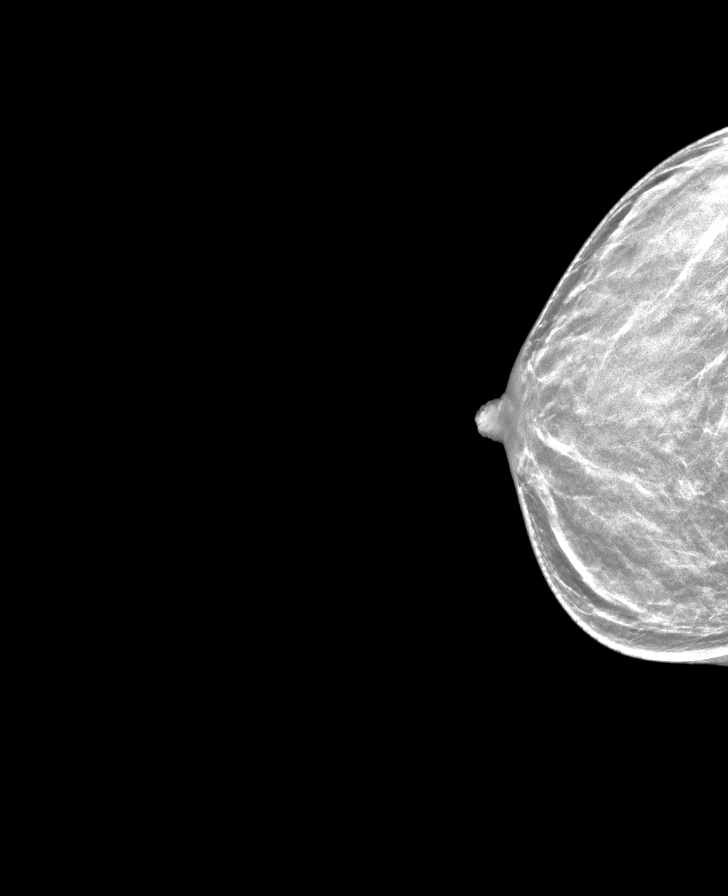

[R MLO synth-2D]
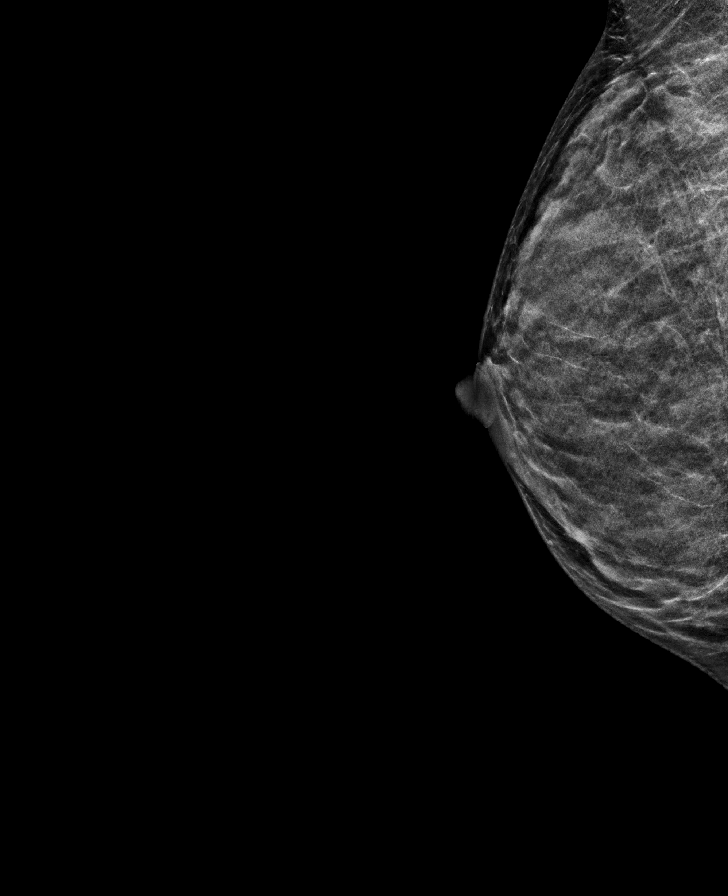

[L CC synth-2D]
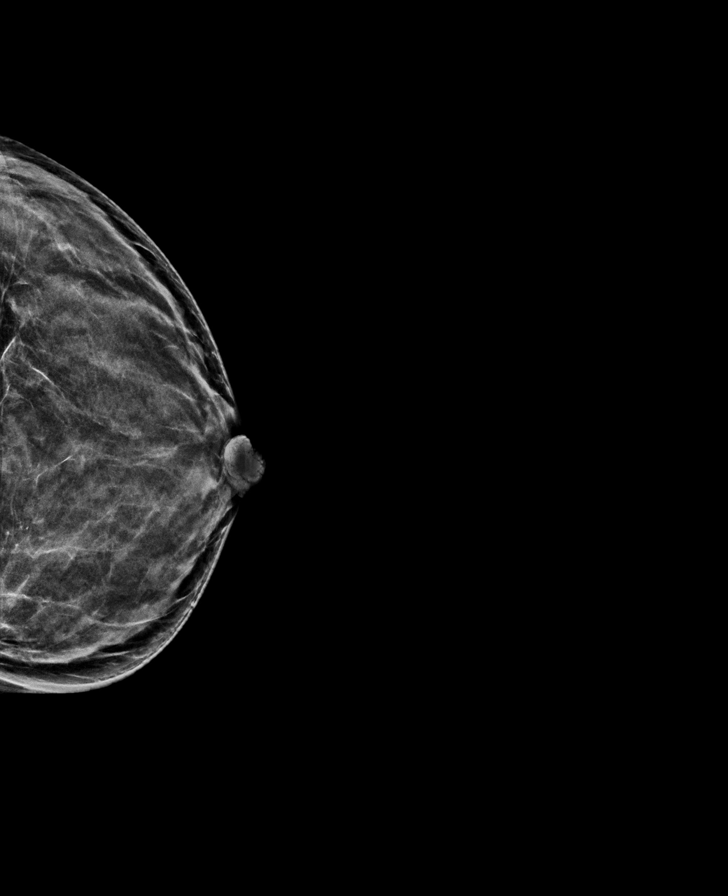

[L MLO synth-2D]
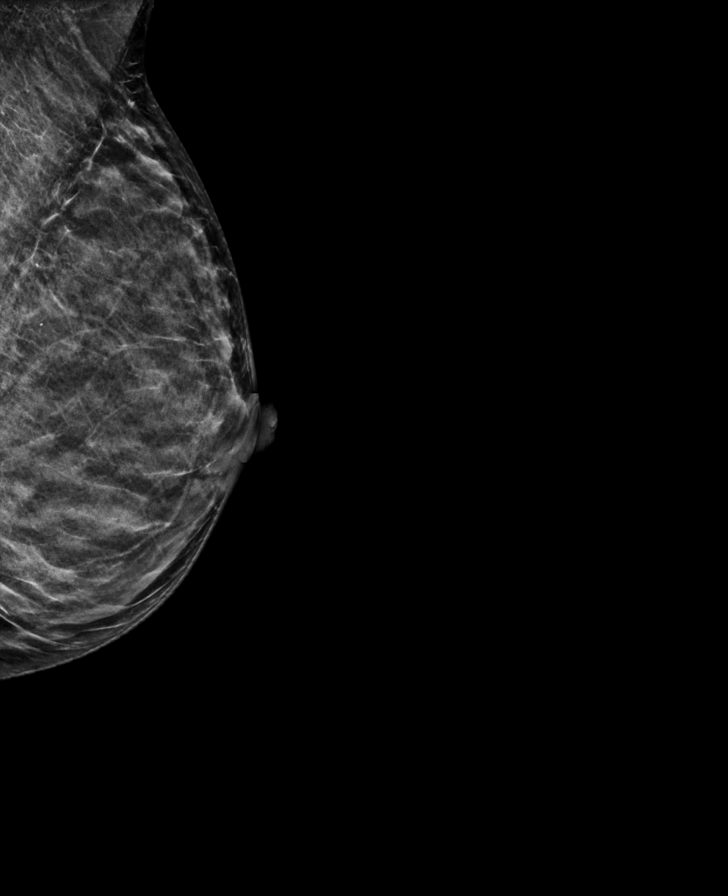

[R MLO tomo · tomo slice 21/41.0]
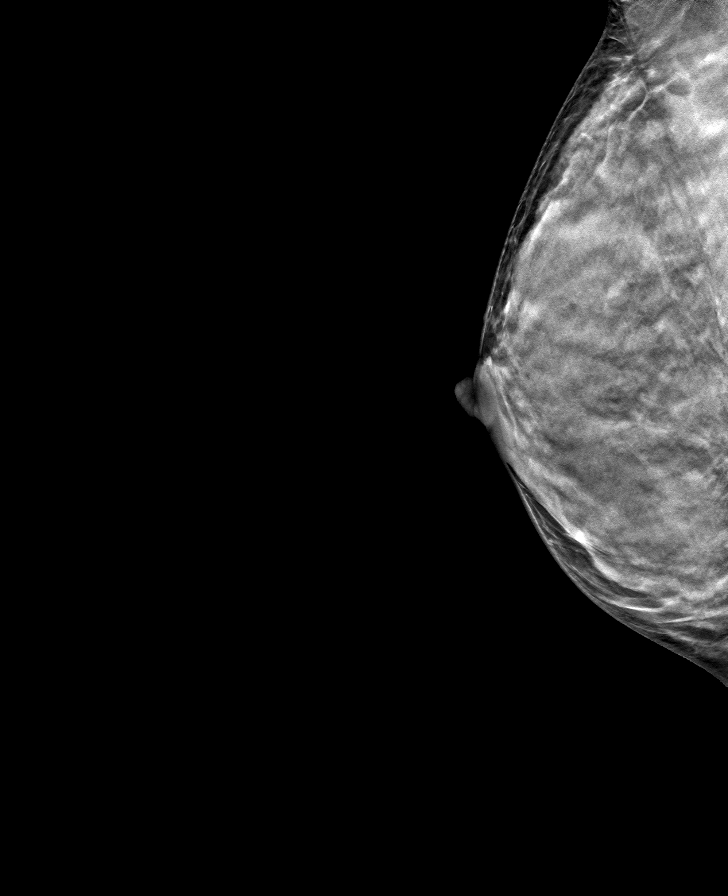

[L CC tomo · tomo slice 25/48.0]
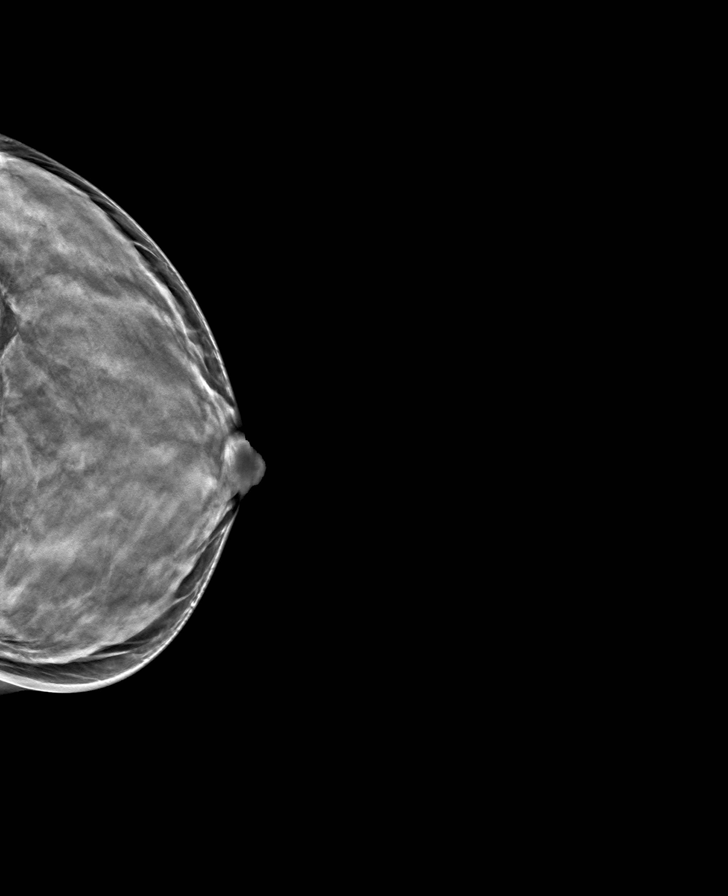

[R CC tomo · tomo slice 23/46.0]
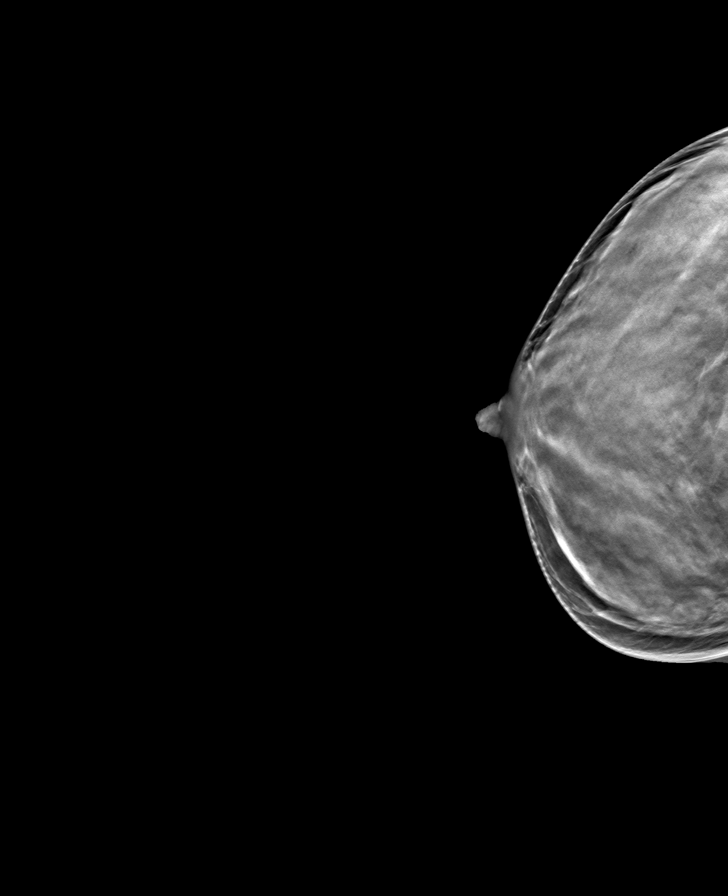

[L MLO tomo · tomo slice 22/43.0]
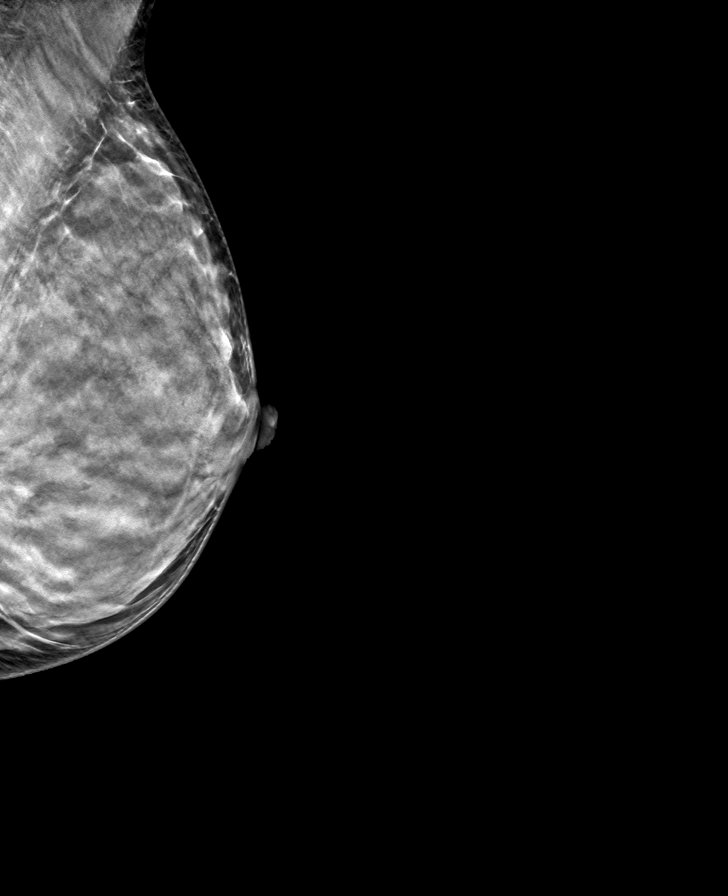

[8 of 24 positions shown; findings below may reference images not displayed]

ACR Breast Density Category d: The breast tissue is extremely dense,
which lowers the sensitivity of mammography.
FINDINGS: There are no findings suspicious for malignancy. The images were
evaluated with computer-aided detection.
IMPRESSION: No mammographic evidence of malignancy. A result letter of this
screening mammogram will be mailed directly to the patient.

RECOMMENDATION:
Screening mammogram in one year. (Code:CA-B-HJO)

BI-RADS CATEGORY  1: Negative.

## 2022-11-21 ENCOUNTER — Other Ambulatory Visit: Payer: Self-pay

## 2022-11-21 ENCOUNTER — Emergency Department (HOSPITAL_COMMUNITY)
Admission: EM | Admit: 2022-11-21 | Discharge: 2022-11-21 | Disposition: A | Discharge status: Home / Self Care | Payer: BLUE CROSS/BLUE SHIELD | Attending: Emergency Medicine | Admitting: Emergency Medicine

## 2022-11-21 DIAGNOSIS — W260XXA Contact with knife, initial encounter: Secondary | ICD-10-CM | POA: Insufficient documentation

## 2022-11-21 DIAGNOSIS — Y92000 Kitchen of unspecified non-institutional (private) residence as  the place of occurrence of the external cause: Secondary | ICD-10-CM | POA: Insufficient documentation

## 2022-11-21 DIAGNOSIS — S65513A Laceration of blood vessel of left middle finger, initial encounter: Secondary | ICD-10-CM | POA: Diagnosis present

## 2022-11-21 MED ORDER — LIDOCAINE HCL 2 % IJ SOLN
10.0000 mL | Freq: Once | INTRAMUSCULAR | Status: AC
  Administered 2022-11-21: 200 mg via INTRADERMAL
  Filled 2022-11-21: qty 20

## 2022-11-21 MED ORDER — OXYCODONE-ACETAMINOPHEN 5-325 MG PO TABS
1.0000 | ORAL_TABLET | Freq: Once | ORAL | Status: AC
  Administered 2022-11-21: 1 via ORAL
  Filled 2022-11-21: qty 1

## 2022-11-21 MED ORDER — SODIUM CHLORIDE 0.9 % IV BOLUS
1000.0000 mL | Freq: Once | INTRAVENOUS | Status: AC
  Administered 2022-11-21: 1000 mL via INTRAVENOUS

## 2022-11-21 NOTE — Discharge Instructions (Signed)
You have been evaluated for your finger laceration.  Nonabsorbable sutures was placed on your left middle finger.  The sutures will need to be removed in 7 to 10 days.  Please remove dressing and check on your wound daily to make sure there are no signs of infection.  If you notice persistent tingling sensation of your finger or if you have any other concern please return.  Otherwise redress the wound daily and wear finger splint for protection.  Drink plenty of fluid and follow-up with your doctor for blood count check as you are at risk for anemia.

## 2022-11-21 NOTE — ED Triage Notes (Signed)
C/o laceration to left ring finger from kitchen knife.  Spurting noted to wound on arrival.  Pressure bandage applied.

## 2022-11-21 NOTE — ED Notes (Signed)
PA aware of patient 

## 2022-11-21 NOTE — ED Provider Notes (Signed)
[place] EMERGENCY DEPARTMENT AT [facility] Provider Note   CSN: [id number] Arrival date & time: 11/21/22  1256     History {Add pertinent medical, surgical, social history, OB history to HPI:1} Chief Complaint  Patient presents with   Laceration    Tinea Nobile is a 52 y.o. female.  The history is provided by the patient and the spouse. No language interpreter was used.  Laceration    52 year old female who is Arabic speaking presenting to the ER for evaluation of finger laceration.  I was able to communicate to patient and husband who is at bedside.  Patient was cooking prior to arrival accidentally cut into her left nondominant hand and suffered a deep laceration to her left middle finger.  She reported acute onset of sharp pain but denies any numbness.  She was unable to stop the bleeding.  She is unsure last tetanus.  No other injury.  Home Medications Prior to Admission medications   Medication Sig Start Date End Date Taking? Authorizing Provider  Prenatal Vit-Fe Fumarate-FA (PRENATAL PO) Take by mouth.    [provider]      Allergies    Patient has no known allergies.    Review of Systems   Review of Systems  All other systems reviewed and are negative.   Physical Exam Updated Vital Signs BP 114/72   Pulse 81   Temp 98.5 F (36.9 C)   Resp 20   Wt 52 kg   SpO2 99%   BMI 20.06 kg/m  Physical Exam Vitals and nursing note reviewed.  Constitutional:      General: She is not in acute distress.    Appearance: She is well-developed.  HENT:     Head: Atraumatic.  Eyes:     Conjunctiva/sclera: Conjunctivae normal.  Pulmonary:     Effort: Pulmonary effort is normal.  Musculoskeletal:        General: Signs of injury (Left middle finger: A v-shaped deep laceration noted to the proximal phalanx on the volar aspect with active arterial bleed.  Patient able to flex and extend finger with brisk cap refill distally.  No deformity.) present.      Cervical back: Neck supple.  Skin:    Findings: No rash.  Neurological:     Mental Status: She is alert.  Psychiatric:        Mood and Affect: Mood normal.     ED Results / Procedures / Treatments   Labs (all labs ordered are listed, but only abnormal results are displayed) Labs Reviewed - No data to display  EKG None  Radiology No results found.  Procedures .Marland KitchenLaceration Repair  Date/Time: 11/21/2022 2:19 PM  Performed by: Fayrene Helper, PA-C Authorized by: Fayrene Helper, PA-C   Consent:    Consent obtained:  Verbal   Consent given by:  Patient   Risks, benefits, and alternatives were discussed: yes     Risks discussed:  Poor wound healing, poor cosmetic result and pain   Alternatives discussed:  Delayed treatment Universal protocol:    Procedure explained and questions answered to patient or proxy's satisfaction: yes     Relevant documents present and verified: yes     Patient identity confirmed:  Verbally with patient Anesthesia:    Anesthesia method:  Nerve block   Block location:  Digital nerve block   Block needle gauge:  25 G   Block anesthetic:  Lidocaine 1% w/o epi   Block technique:  Digital nerve block  Block injection procedure:  Anatomic landmarks identified, introduced needle, incremental injection, anatomic landmarks palpated and negative aspiration for blood   Block outcome:  Anesthesia achieved Laceration details:    Location:  Finger   Finger location:  L long finger   Length (cm):  6   Depth (mm):  4 Pre-procedure details:    Preparation:  Patient was prepped and draped in usual sterile fashion Exploration:    Limited defect created (wound extended): no     Hemostasis achieved with:  Direct pressure   Imaging outcome: foreign body not noted     Wound exploration: wound explored through full range of motion and entire depth of wound visualized     Wound extent: vascular damage     Wound extent: no nerve damage, no tendon damage and no  underlying fracture   Treatment:    Area cleansed with:  Saline   Amount of cleaning:  Standard   Irrigation solution:  Sterile saline   Visualized foreign bodies/material removed: no     Debridement:  None   Undermining:  None Skin repair:    Repair method:  Sutures   Suture size:  5-0   Suture material:  Prolene   Suture technique:  Simple interrupted   Number of sutures:  11 Approximation:    Approximation:  Close Repair type:    Repair type:  Intermediate Post-procedure details:    Dressing:  Splint for protection and bulky dressing   Procedure completion:  Tolerated with difficulty   {Document cardiac monitor, telemetry assessment procedure when appropriate:1}  Medications Ordered in ED Medications - No data to display  ED Course/ Medical Decision Making/ A&P   {   Click here for ABCD2, HEART and other calculatorsREFRESH Note before signing :1}                          Medical Decision Making Risk Prescription drug management.   BP 109/62   Pulse 81   Temp 98.5 F (36.9 C)   Resp 16   Wt 52 kg   SpO2 99%   BMI 20.06 kg/m   87:1 PM  52 year old female who is Arabic speaking presenting to the ER for evaluation of finger laceration.  I was able to communicate to patient and husband who is at bedside.  Patient was cooking prior to arrival accidentally cut into her left nondominant hand and suffered a deep laceration to her left middle finger.  She reported acute onset of sharp pain but denies any numbness.  She was unable to stop the bleeding.  She is unsure last tetanus.  No other injury.  On exam, patient has a V shaped deep laceration to the volar aspect of her left middle finger squirting blood suggestive of an arterial bleed.  No obvious tendon nerve injury no bony injury.  She is able to flex and extend the finger she has brisk cap refill.  I performed a digital block however patient shortly vasovagal syncopized. She was placed on heart monitor device, gave  fluids, provide supplemental oxygen and was monitored closely.  Her blood pressure did drop to 60 systolic but slightly improved and patient became more alert.  Laceration has been repaired by me, finger was placed in a finger splint, and patient will be monitor for the next hour.    {Document critical care time when appropriate:1} {Document review of labs and clinical decision tools ie heart score, Chads2Vasc2 etc:1}  {Document your independent  review of radiology images, and any outside records:1} {Document your discussion with family members, caretakers, and with consultants:1} {Document social determinants of health affecting pt's care:1} {Document your decision making why or why not admission, treatments were needed:1} Final Clinical Impression(s) / ED Diagnoses Final diagnoses:  None    Rx / DC Orders ED Discharge Orders     None

## 2022-12-01 ENCOUNTER — Other Ambulatory Visit: Payer: Self-pay

## 2022-12-01 ENCOUNTER — Emergency Department (HOSPITAL_COMMUNITY)
Admission: EM | Admit: 2022-12-01 | Discharge: 2022-12-01 | Disposition: A | Discharge status: Home / Self Care | Payer: BLUE CROSS/BLUE SHIELD | Attending: Emergency Medicine | Admitting: Emergency Medicine

## 2022-12-01 ENCOUNTER — Encounter (HOSPITAL_COMMUNITY): Payer: Self-pay

## 2022-12-01 DIAGNOSIS — Z4800 Encounter for change or removal of nonsurgical wound dressing: Secondary | ICD-10-CM | POA: Insufficient documentation

## 2022-12-01 DIAGNOSIS — R55 Syncope and collapse: Secondary | ICD-10-CM | POA: Insufficient documentation

## 2022-12-01 DIAGNOSIS — Z4802 Encounter for removal of sutures: Secondary | ICD-10-CM | POA: Diagnosis not present

## 2022-12-01 DIAGNOSIS — Z5189 Encounter for other specified aftercare: Secondary | ICD-10-CM

## 2022-12-01 MED ORDER — SODIUM CHLORIDE 0.9 % IV BOLUS: 1000.0000 mL | Freq: Once | INTRAVENOUS | Status: DC

## 2022-12-01 MED ORDER — OXYCODONE-ACETAMINOPHEN 5-325 MG PO TABS: 1.0000 | ORAL_TABLET | Freq: Four times a day (QID) | ORAL | 0 refills | Status: AC | PRN

## 2022-12-01 NOTE — Discharge Instructions (Signed)
You were seen in the emergency department today for a wound to your left middle finger.  We remove the sutures and placed a new antibiotic dressing on this.  I have also sent you some pain medication to the pharmacy.  This medication is called Percocet.  You have been prescribed a medication that is considered an opiate. Opiates are pain medications that should be used with caution. It is important that you do not drive while taking this medication as it can cause drowsiness and impaired reaction times. Do not mix this medication with benzodiazepine medications or alcohol as this can cause respiratory depression. Additionally, opiates have addicting properties to them. Please use medication as prescribed by your provider.  Additionally we are having you follow-up with hand surgery this week.  Please call today to Dr. Georgia Duff office.  I have placed this information in your discharge paperwork.

## 2022-12-01 NOTE — ED Triage Notes (Addendum)
Pt presents for suture removal.  Pt had a finger laceration on 4/26.  Also, Pt reports intermittent shooting pains up arm.  Dressing has not been changed since initial dressing and gauze are stuck to the site.  Additionally, splint and outer wrap were extremely tight.  Pt reports less pain after dressing removal.     No redness noted to finger.

## 2022-12-01 NOTE — ED Provider Notes (Signed)
[place] EMERGENCY DEPARTMENT AT [facility] Provider Note   CSN: [id number] Arrival date & time: 12/01/22  1105     History  Chief Complaint  Patient presents with   Suture / Staple Removal    Chloe Tyler is a 52 y.o. female.  With no significant past medical history presents to the emergency department for suture removal.  Patient was here on 11/21/22 after lacerating her left middle finger while cooking.  She had 11 sutures placed in the middle finger with no obvious tendon or nerve injury.  She may have had a small arterial bleed based on the provider note from her previous.  She also had an episode of vasovagal syncope after her nerve block requiring her to have some IV fluids.  Follow-up in 7 days to have her sutures removed.  She states that she has not removed the Xeroform dressing that was on there since it being placed.    Suture / Staple Removal       Home Medications Prior to Admission medications   Medication Sig Start Date End Date Taking? Authorizing Provider  oxyCODONE-acetaminophen (PERCOCET/ROXICET) 5-325 MG tablet Take 1 tablet by mouth every 6 (six) hours as needed for up to 3 days for severe pain. 12/01/22 12/04/22 Yes Cristopher Peru, PA-C  Prenatal Vit-Fe Fumarate-FA (PRENATAL PO) Take by mouth.    [provider]      Allergies    Patient has no known allergies.    Review of Systems   Review of Systems  Skin:  Positive for wound.  All other systems reviewed and are negative.   Physical Exam Updated Vital Signs BP 99/83 (BP Location: Right Arm)   Pulse 71   Temp 97.6 F (36.4 C) (Oral)   Resp 15   SpO2 100%  Physical Exam Vitals and nursing note reviewed.  HENT:     Head: Normocephalic and atraumatic.  Eyes:     General: No scleral icterus. Cardiovascular:     Pulses: Normal pulses.  Pulmonary:     Effort: Pulmonary effort is normal. No respiratory distress.  Skin:    Findings: No rash.     Comments: See photo   Neurological:     General: No focal deficit present.     Mental Status: She is alert and oriented to person, place, and time.  Psychiatric:        Mood and Affect: Mood normal.        Behavior: Behavior normal.        Thought Content: Thought content normal.        Judgment: Judgment normal.     ED Results / Procedures / Treatments   Labs (all labs ordered are listed, but only abnormal results are displayed) Labs Reviewed - No data to display  EKG None  Radiology No results found.  Procedures .Suture Removal  Date/Time: 12/01/2022 1:39 PM  Performed by: Cristopher Peru, PA-C Authorized by: Cristopher Peru, PA-C   Consent:    Consent obtained:  Verbal   Consent given by:  Patient   Risks, benefits, and alternatives were discussed: yes     Risks discussed:  Bleeding, pain and wound separation   Alternatives discussed:  No treatment Universal protocol:    Procedure explained and questions answered to patient or proxy's satisfaction: yes     Relevant documents present and verified: yes     Test results available: yes     Imaging studies available: yes  Required blood products, implants, devices, and special equipment available: yes     Site/side marked: yes     Immediately prior to procedure, a time out was called: yes     Patient identity confirmed:  Verbally with patient Location:    Location:  Upper extremity   Upper extremity location:  Hand   Hand location:  L long finger Procedure details:    Wound appearance:  Tender and moist (not approximated, appears possibly infected, skin appears dead)   Number of sutures removed:  8 Post-procedure details:    Post-removal:  Dressing applied   Procedure completion:  Tolerated well, no immediate complications    Medications Ordered in ED Medications  sodium chloride 0.9 % bolus 1,000 mL (has no administration in time range)    ED Course/ Medical Decision Making/ A&P                             Medical  Decision Making Risk Prescription drug management.  Initial Impression and Ddx 52 year old female who presents to the emergency department for suture removal Patient PMH that increases complexity of ED encounter: None  Interpretation of Diagnostics I independent reviewed and interpreted the labs as followed: Not indicated  - I independently visualized the following imaging with scope of interpretation limited to determining acute life threatening conditions related to emergency care: Not indicated  Patient Reassessment and Ultimate Disposition/Management Overall nonseptic and nontoxic appearing female, hemodynamically stable presenting for suture removal.  She had an aluminum splint on the finger that was quite tight when she arrived.  This was removed.  She had improvement in her pain symptoms after this.  However she had a Xerofoam dressing over the sutures that had not been removed since her previous emergency department visit.  After removal she had presyncope is likely vasovagal.  She did this on her previous emergency department visit.  She was given some IV fluids.  Skin underneath the dressing appears to be dead.  It is pale white and boggy appearing as if it is been wet.  The deep laceration does not appear to be fully approximated.  The more medial wound appears to be more well-approximated.  I will consult hand for their recommendations given that she is having ongoing pain.  Consulted and spoke with Earney Hamburg, PA-C with hand surgery.  He recommends removing the sutures, placing a new Xeroform gauze and seeing Dr. Frazier Butt this week for follow-up.  Does not feel that there needs to be further intervention at this time in the emergency department. I removed 8 sutures. The previous documentation notes 11? There are no more sutures present superficially. It is not documented in the previous note of there are internal sutures. I placed new Xeroform gauze and bandage.   Will prescribe  short course of Percocet as she is having severe pain to her finger not controlled by Tylenol or Motrin. Discussed she will need f/u this week with hand. I have sent ambulatory referral and given them Dr. Frazier Butt information to close loop. Patient and husband verbalize understanding. Return precautions given.  The patient has been appropriately medically screened and/or stabilized in the ED. I have low suspicion for any other emergent medical condition which would require further screening, evaluation or treatment in the ED or require inpatient management. At time of discharge the patient is hemodynamically stable and in no acute distress. I have discussed work-up results and diagnosis with patient and answered all  questions. Patient is agreeable with discharge plan. We discussed strict return precautions for returning to the emergency department and they verbalized understanding.     Patient management required discussion with the following services or consulting groups:  Hand Surgery  Complexity of Problems Addressed Acute uncomplicated illness or injury with no diagnostics  Additional Data Reviewed and Analyzed Further history obtained from: Further history from spouse/family member, Past medical history and medications listed in the EMR, Prior ED visit notes, and Care Everywhere  Patient Encounter Risk Assessment Minor Procedures and SDOH impact on management  Final Clinical Impression(s) / ED Diagnoses Final diagnoses:  Visit for wound check  Encounter for removal of sutures    Rx / DC Orders ED Discharge Orders          Ordered    Ambulatory referral to Hand Surgery       Comments: Dr. Marlyne Beards referral   12/01/22 1338    oxyCODONE-acetaminophen (PERCOCET/ROXICET) 5-325 MG tablet  Every 6 hours PRN        12/01/22 1338              Cristopher Peru, PA-C 12/01/22 1342    Benjiman Core, MD 12/01/22 1459

## 2022-12-04 ENCOUNTER — Emergency Department (HOSPITAL_COMMUNITY)
Admission: EM | Admit: 2022-12-04 | Discharge: 2022-12-04 | Disposition: A | Discharge status: Home / Self Care | Payer: BLUE CROSS/BLUE SHIELD | Attending: Student | Admitting: Student

## 2022-12-04 ENCOUNTER — Other Ambulatory Visit: Payer: Self-pay

## 2022-12-04 DIAGNOSIS — F17200 Nicotine dependence, unspecified, uncomplicated: Secondary | ICD-10-CM | POA: Diagnosis not present

## 2022-12-04 DIAGNOSIS — Z4800 Encounter for change or removal of nonsurgical wound dressing: Secondary | ICD-10-CM | POA: Diagnosis present

## 2022-12-04 DIAGNOSIS — Z5189 Encounter for other specified aftercare: Secondary | ICD-10-CM

## 2022-12-04 NOTE — ED Triage Notes (Signed)
Pt arrives to have dressing to L middle finger changed. Pt obtained laceration to finger on 4/26 and had sutures placed at that time. Sutures removed on 5/6. Pt c/o continued pain to finger, especially at night. Has been taking prescribed percocet for pain. Ambulatory referral for hand surgery made at that time, but pt has not received a call for follow up.

## 2022-12-04 NOTE — ED Provider Notes (Signed)
[name] Provider Note  CSN: [id number] Arrival date & time: 12/04/22 1944  Chief Complaint(s) Wound Check  HPI Chloe Tyler is a 52 y.o. female with PMH finger laceration on 11/21/2022 status post repair with sutures and suture removal on 12/01/2022 who presents emergency department for evaluation of a wound check and bandage change.  She states that she still has pain with flexion at the finger but has not been experiencing any streaking erythema, numbness, tingling, weakness of the extremity or any associated fever, shortness of breath, nausea, vomiting or other systemic symptoms.  She states that she has not followed up with hand surgery as they have not called her.     Past Medical History Past Medical History:  Diagnosis Date   Medical history non-contributory    UTI (urinary tract infection)    Patient Active Problem List   Diagnosis Date Noted   Stomach pain 01/26/2021   Home Medication(s) Prior to Admission medications   Medication Sig Start Date End Date Taking? Authorizing Provider  oxyCODONE-acetaminophen (PERCOCET/ROXICET) 5-325 MG tablet Take 1 tablet by mouth every 6 (six) hours as needed for up to 3 days for severe pain. 12/01/22 12/04/22  Cristopher Peru, PA-C  Prenatal Vit-Fe Fumarate-FA (PRENATAL PO) Take by mouth.    [provider]                                                                                                                                    Past Surgical History Past Surgical History:  Procedure Laterality Date   NO PAST SURGERIES     Family History Family History  Problem Relation Age of Onset   Hypertension Mother     Social History Social History   Tobacco Use   Smoking status: Some Days   Smokeless tobacco: Never   Tobacco comments:    hookah  Vaping Use   Vaping Use: Never used  Substance Use Topics   Alcohol use: Never   Drug use: Never   Allergies Patient has no known  allergies.  Review of Systems Review of Systems  Skin:  Positive for wound.    Physical Exam Vital Signs  I have reviewed the triage vital signs BP 129/77   Pulse 63   Temp 97.9 F (36.6 C)   Resp 16   Ht 5\' 3"  (1.6 m)   Wt 52 kg   LMP 11/28/2022 (Approximate)   SpO2 100%   BMI 20.31 kg/m   Physical Exam Vitals and nursing note reviewed.  Constitutional:      General: She is not in acute distress.    Appearance: She is well-developed.  HENT:     Head: Normocephalic and atraumatic.  Eyes:     Conjunctiva/sclera: Conjunctivae normal.  Cardiovascular:     Rate and Rhythm: Normal rate and regular rhythm.     Heart sounds: No murmur heard. Pulmonary:  Effort: Pulmonary effort is normal. No respiratory distress.     Breath sounds: Normal breath sounds.  Abdominal:     Palpations: Abdomen is soft.     Tenderness: There is no abdominal tenderness.  Musculoskeletal:        General: No swelling.     Cervical back: Neck supple.  Skin:    General: Skin is warm and dry.     Capillary Refill: Capillary refill takes less than 2 seconds.     Findings: Lesion present.  Neurological:     Mental Status: She is alert.  Psychiatric:        Mood and Affect: Mood normal.     ED Results and Treatments Labs (all labs ordered are listed, but only abnormal results are displayed) Labs Reviewed - No data to display                                                                                                                        Radiology No results found.  Pertinent labs & imaging results that were available during my care of the patient were reviewed by me and considered in my medical decision making (see MDM for details).  Medications Ordered in ED Medications - No data to display                                                                                                                                   Procedures Procedures  (including critical care  time)  Medical Decision Making / ED Course   This patient presents to the ED for concern of wound check, this involves an extensive number of treatment options, and is a complaint that carries with it a high risk of complications and morbidity.  The differential diagnosis includes appropriate wound healing, granulation tissue, wound infection, nonunion  MDM: Patient seen emergency room for evaluation of a bandage change.  Physical exam reveals a well healing, granulating laceration to the third finger on the left.  Bandage changed and patient given additional wound supplies to take care of this at home.  A referral was placed back to Dr. Frazier Butt of hand surgery and I sent him an epic message to help coordinate outpatient follow-up for the patient.  At this time patient does not meet inpatient criteria for admission she is safe for discharge with outpatient follow-up   Additional history obtained: -Additional history obtained from husband -External  records from outside source obtained and reviewed including: Chart review including previous notes, labs, imaging, consultation notes    Medicines ordered and prescription drug management: No orders of the defined types were placed in this encounter.   -I have reviewed the patients home medicines and have made adjustments as needed  Critical interventions none    Cardiac Monitoring: The patient was maintained on a cardiac monitor.  I personally viewed and interpreted the cardiac monitored which showed an underlying rhythm of: NSR  Social Determinants of Health:  Factors impacting patients care include: arabic speaking   Reevaluation: After the interventions noted above, I reevaluated the patient and found that they have :stayed the same  Co morbidities that complicate the patient evaluation  Past Medical History:  Diagnosis Date   Medical history non-contributory    UTI (urinary tract infection)       Dispostion: I considered  admission for this patient, and with bandage change successfully she is safe for discharge with outpatient hand surgery.  She was instructed to call the office     Final Clinical Impression(s) / ED Diagnoses Final diagnoses:  Visit for wound check     @PCDICTATION @    Glendora Score, MD 12/04/22 [id number]
